# Patient Record
Sex: Female | Born: 1955 | Hispanic: No | Smoking: Never smoker
Health system: Southern US, Community
[De-identification: ages and names within clinical notes are randomized; demographics above are authoritative.]

## PROBLEM LIST (undated history)

## (undated) DIAGNOSIS — M1990 Unspecified osteoarthritis, unspecified site: Secondary | ICD-10-CM

## (undated) HISTORY — PX: JOINT REPLACEMENT: SHX530

## (undated) HISTORY — PX: BRAIN SURGERY: SHX531

## (undated) HISTORY — PX: TUMOR REMOVAL: SHX12

## (undated) HISTORY — PX: CHOLECYSTECTOMY: SHX55

---

## 2018-09-15 NOTE — H&P (Signed)
TOTAL HIP ADMISSION H&P  Patient is admitted for right total hip arthroplasty, anterior approach.  Subjective:  Chief Complaint: Right hip primary OA / pain  HPI: Mary House, 63 y.o. female, has a history of pain and functional disability in the right hip(s) due to arthritis and patient has failed non-surgical conservative treatments for greater than 12 weeks to include NSAID's and/or analgesics, corticosteriod injections, use of assistive devices and activity modification.  Onset of symptoms was gradual starting 2+ years ago with gradually worsening course since that time.The patient noted no past surgery on the right hip(s).  Patient currently rates pain in the right hip at 9 out of 10 with activity. Patient has night pain, worsening of pain with activity and weight bearing, trendelenberg gait, pain that interfers with activities of daily living and pain with passive range of motion. Patient has evidence of periarticular osteophytes and joint space narrowing by imaging studies. This condition presents safety issues increasing the risk of falls.  There is no current active infection.  Risks, benefits and expectations were discussed with the patient.  Risks including but not limited to the risk of anesthesia, blood clots, nerve damage, blood vessel damage, failure of the prosthesis, infection and up to and including death.  Patient understand the risks, benefits and expectations and wishes to proceed with surgery.   PCP: System, Pcp Not In  D/C Plans:       Home  Post-op Meds:       No Rx given  Tranexamic Acid:      To be given - IV   Decadron:      Is to be given  FYI:      ASA  Norco  DME:   Rx given for - 3-n-1;  Already has RW  PT:   No PT     No past medical history on file.    No current facility-administered medications for this encounter.    No current outpatient medications on file.   Allergies not on file   Social History   Tobacco Use  . Smoking status: Not on  file  Substance Use Topics  . Alcohol use: Not on file     Review of Systems  Constitutional: Negative.   HENT: Negative.   Eyes: Negative.   Respiratory: Negative.   Cardiovascular: Negative.   Gastrointestinal: Negative.   Genitourinary: Negative.   Musculoskeletal: Positive for back pain and joint pain.  Skin: Negative.   Neurological: Negative.   Endo/Heme/Allergies: Negative.   Psychiatric/Behavioral: Negative.     Objective:  Physical Exam  Constitutional: She is oriented to person, place, and time. She appears well-developed.  HENT:  Head: Normocephalic.  Eyes: Pupils are equal, round, and reactive to light.  Neck: Neck supple. No JVD present. No tracheal deviation present. No thyromegaly present.  Cardiovascular: Normal rate, regular rhythm and intact distal pulses.  Respiratory: Effort normal and breath sounds normal. No respiratory distress. She has no wheezes.  GI: Soft. There is no abdominal tenderness. There is no guarding.  Musculoskeletal:     Right hip: She exhibits decreased range of motion, decreased strength, tenderness and bony tenderness. She exhibits no swelling, no deformity and no laceration.  Lymphadenopathy:    She has no cervical adenopathy.  Neurological: She is alert and oriented to person, place, and time.  Skin: Skin is warm and dry.  Psychiatric: She has a normal mood and affect.     Imaging Review Plain radiographs demonstrate severe degenerative joint disease of  the right hip(s). The bone quality appears to be good for age and reported activity level.      Assessment/Plan:  End stage arthritis, right hip  The patient history, physical examination, clinical judgement of the provider and imaging studies are consistent with end stage degenerative joint disease of the right hip(s) and total hip arthroplasty is deemed medically necessary. The treatment options including medical management, injection therapy, arthroscopy and arthroplasty  were discussed at length. The risks and benefits of total hip arthroplasty were presented and reviewed. The risks due to aseptic loosening, infection, stiffness, dislocation/subluxation,  thromboembolic complications and other imponderables were discussed.  The patient acknowledged the explanation, agreed to proceed with the plan and consent was signed. Patient is being admitted for inpatient treatment for surgery, pain control, PT, OT, prophylactic antibiotics, VTE prophylaxis, progressive ambulation and ADL's and discharge planning.The patient is planning to be discharged home.    Anastasio AuerbachMatthew S. Hedwig Mcfall   PA-C  09/15/2018, 1:10 PM

## 2018-09-16 NOTE — Progress Notes (Signed)
SPOKE W/PT     SCREENING SYMPTOMS OF COVID 19:   COUGH--NO  RUNNY NOSE--- NO  SORE THROAT---NO  NASAL CONGESTION----NO  SNEEZING----NO  SHORTNESS OF BREATH---NO  DIFFICULTY BREATHING---NO  TEMP >100.0 -----NO  UNEXPLAINED BODY ACHES------NO  CHILLS --------NO  HEADACHES ---------NO  LOSS OF SMELL/ TASTE --------NO    HAVE YOU OR ANY FAMILY MEMBER TRAVELLED PAST 14 DAYS OUT OF THE   COUNTY---NO STATE----NO COUNTRY----NO  HAVE YOU OR ANY FAMILY MEMBER BEEN EXPOSED TO ANYONE WITH COVID 19? NO    

## 2018-09-16 NOTE — Patient Instructions (Signed)
Mary House  09/16/2018   Your procedure is scheduled on: 09-21-18   Report to [facility] Main  Entrance    Report to Admitting at 5:30 AM    Call this number if you have problems the morning of surgery [phone]    Remember: NO SOLID FOOD AFTER MIDNIGHT THE NIGHT PRIOR TO SURGERY. NOTHING BY MOUTH EXCEPT CLEAR LIQUIDS UNTIL 3 HOURS PRIOR TO SCHEULED SURGERY. PLEASE FINISH ENSURE DRINK PER SURGEON ORDER 3 HOURS PRIOR TO SCHEDULED SURGERY TIME WHICH NEEDS TO BE COMPLETED AT 4:30 AM.  CLEAR LIQUID DIET   Foods Allowed                                                                     Foods Excluded  Coffee and tea, regular and decaf                             liquids that you cannot  Plain Jell-O in any flavor                                             see through such as: Fruit ices (not with fruit pulp)                                     milk, soups, orange juice  Iced Popsicles                                    All solid food Carbonated beverages, regular and diet                                    Cranberry, grape and apple juices Sports drinks like Gatorade Lightly seasoned clear broth or consume(fat free) Sugar, honey syrup  Sample Menu Breakfast                                Lunch                                     Supper Cranberry juice                    Beef broth                            Chicken broth Jell-O                                     Grape juice  Apple juice Coffee or tea                        Jell-O                                      Popsicle                                                Coffee or tea                        Coffee or tea  _____________________________________________________________________      BRUSH YOUR TEETH MORNING OF SURGERY AND RINSE YOUR MOUTH OUT, NO CHEWING GUM CANDY OR MINTS.     Take these medicines the morning of surgery with A SIP OF WATER: None                                 You may not have any metal on your body including hair pins and              piercings  Do not wear jewelry, make-up, lotions, powders or perfumes, deodorant             Do not wear nail polish.  Do not shave  48 hours prior to surgery.                 Do not bring valuables to the hospital. [facility] IS NOT             RESPONSIBLE   FOR VALUABLES.  Contacts, dentures or bridgework may not be worn into surgery.       Special Instructions: N/A              Please read over the following fact sheets you were given: _____________________________________________________________________             [facility] - Preparing for Surgery Before surgery, you can play an important role.  Because skin is not sterile, your skin needs to be as free of germs as possible.  You can reduce the number of germs on your skin by washing with CHG (chlorahexidine gluconate) soap before surgery.  CHG is an antiseptic cleaner which kills germs and bonds with the skin to continue killing germs even after washing. Please DO NOT use if you have an allergy to CHG or antibacterial soaps.  If your skin becomes reddened/irritated stop using the CHG and inform your nurse when you arrive at Short Stay. Do not shave (including legs and underarms) for at least 48 hours prior to the first CHG shower.  You may shave your face/neck. Please follow these instructions carefully:  1.  Shower with CHG Soap the night before surgery and the  morning of Surgery.  2.  If you choose to wash your hair, wash your hair first as usual with your  normal  shampoo.  3.  After you shampoo, rinse your hair and body thoroughly to remove the  shampoo.                           4.  Use CHG as you would any other liquid soap.  You can apply chg directly  to the skin and wash                       Gently with a scrungie or clean washcloth.  5.  Apply the CHG Soap to your body ONLY FROM THE NECK DOWN.   Do not use on face/ open                            Wound or open sores. Avoid contact with eyes, ears mouth and genitals (private parts).                       Wash face,  Genitals (private parts) with your normal soap.             6.  Wash thoroughly, paying special attention to the area where your surgery  will be performed.  7.  Thoroughly rinse your body with warm water from the neck down.  8.  DO NOT shower/wash with your normal soap after using and rinsing off  the CHG Soap.                9.  Pat yourself dry with a clean towel.            10.  Wear clean pajamas.            11.  Place clean sheets on your bed the night of your first shower and do not  sleep with pets. Day of Surgery : Do not apply any lotions/deodorants the morning of surgery.  Please wear clean clothes to the hospital/surgery center.  FAILURE TO FOLLOW THESE INSTRUCTIONS MAY RESULT IN THE CANCELLATION OF YOUR SURGERY PATIENT SIGNATURE_________________________________  NURSE SIGNATURE__________________________________  ________________________________________________________________________   Rogelia MireIncentive Spirometer  An incentive spirometer is a tool that can help keep your lungs clear and active. This tool measures how well you are filling your lungs with each breath. Taking long deep breaths may help reverse or decrease the chance of developing breathing (pulmonary) problems (especially infection) following:  A long period of time when you are unable to move or be active. BEFORE THE PROCEDURE   If the spirometer includes an indicator to show your best effort, your nurse or respiratory therapist will set it to a desired goal.  If possible, sit up straight or lean slightly forward. Try not to slouch.  Hold the incentive spirometer in an upright position. INSTRUCTIONS FOR USE  1. Sit on the edge of your bed if possible, or sit up as far as you can in bed or on a chair. 2. Hold the incentive spirometer in an upright position. 3. Breathe out  normally. 4. Place the mouthpiece in your mouth and seal your lips tightly around it. 5. Breathe in slowly and as deeply as possible, raising the piston or the ball toward the top of the column. 6. Hold your breath for 3-5 seconds or for as long as possible. Allow the piston or ball to fall to the bottom of the column. 7. Remove the mouthpiece from your mouth and breathe out normally. 8. Rest for a few seconds and repeat Steps 1 through 7 at least 10 times every 1-2 hours when you are awake. Take your time and take a few normal breaths between deep breaths. 9. The spirometer may include an indicator to show your  best effort. Use the indicator as a goal to work toward during each repetition. 10. After each set of 10 deep breaths, practice coughing to be sure your lungs are clear. If you have an incision (the cut made at the time of surgery), support your incision when coughing by placing a pillow or rolled up towels firmly against it. Once you are able to get out of bed, walk around indoors and cough well. You may stop using the incentive spirometer when instructed by your caregiver.  RISKS AND COMPLICATIONS  Take your time so you do not get dizzy or light-headed.  If you are in pain, you may need to take or ask for pain medication before doing incentive spirometry. It is harder to take a deep breath if you are having pain. AFTER USE  Rest and breathe slowly and easily.  It can be helpful to keep track of a log of your progress. Your caregiver can provide you with a simple table to help with this. If you are using the spirometer at home, follow these instructions: [place] IF:   You are having difficultly using the spirometer.  You have trouble using the spirometer as often as instructed.  Your pain medication is not giving enough relief while using the spirometer.  You develop fever of 100.5 F (38.1 C) or higher. SEEK IMMEDIATE MEDICAL CARE IF:   You cough up bloody sputum  that had not been present before.  You develop fever of 102 F (38.9 C) or greater.  You develop worsening pain at or near the incision site. MAKE SURE YOU:   Understand these instructions.  Will watch your condition.  Will get help right away if you are not doing well or get worse. Document Released: 09/08/2006 Document Revised: 07/21/2011 Document Reviewed: 11/09/2006 [employer] Patient Information 2014 [employer], [place].   ________________________________________________________________________  WHAT IS A BLOOD TRANSFUSION? Blood Transfusion Information  A transfusion is the replacement of blood or some of its parts. Blood is made up of multiple cells which provide different functions.  Red blood cells carry oxygen and are used for blood loss replacement.  White blood cells fight against infection.  Platelets control bleeding.  Plasma helps clot blood.  Other blood products are available for specialized needs, such as hemophilia or other clotting disorders. BEFORE THE TRANSFUSION  Who gives blood for transfusions?   Healthy volunteers who are fully evaluated to make sure their blood is safe. This is blood bank blood. Transfusion therapy is the safest it has ever been in the practice of medicine. Before blood is taken from a donor, a complete history is taken to make sure that person has no history of diseases nor engages in risky social behavior (examples are intravenous drug use or sexual activity with multiple partners). The donor's travel history is screened to minimize risk of transmitting infections, such as malaria. The donated blood is tested for signs of infectious diseases, such as HIV and hepatitis. The blood is then tested to be sure it is compatible with you in order to minimize the chance of a transfusion reaction. If you or a relative donates blood, this is often done in anticipation of surgery and is not appropriate for emergency situations. It takes many days to  process the donated blood. RISKS AND COMPLICATIONS Although transfusion therapy is very safe and saves many lives, the main dangers of transfusion include:   Getting an infectious disease.  Developing a transfusion reaction. This is an allergic reaction to something  in the blood you were given. Every precaution is taken to prevent this. The decision to have a blood transfusion has been considered carefully by your caregiver before blood is given. Blood is not given unless the benefits outweigh the risks. AFTER THE TRANSFUSION  Right after receiving a blood transfusion, you will usually feel much better and more energetic. This is especially true if your red blood cells have gotten low (anemic). The transfusion raises the level of the red blood cells which carry oxygen, and this usually causes an energy increase.  The nurse administering the transfusion will monitor you carefully for complications. HOME CARE INSTRUCTIONS  No special instructions are needed after a transfusion. You may find your energy is better. Speak with your caregiver about any limitations on activity for underlying diseases you may have. SEEK MEDICAL CARE IF:   Your condition is not improving after your transfusion.  You develop redness or irritation at the intravenous (IV) site. SEEK IMMEDIATE MEDICAL CARE IF:  Any of the following symptoms occur over the next 12 hours:  Shaking chills.  You have a temperature by mouth above 102 F (38.9 C), not controlled by medicine.  Chest, back, or muscle pain.  People around you feel you are not acting correctly or are confused.  Shortness of breath or difficulty breathing.  Dizziness and fainting.  You get a rash or develop hives.  You have a decrease in urine output.  Your urine turns a dark color or changes to pink, red, or brown. Any of the following symptoms occur over the next 10 days:  You have a temperature by mouth above 102 F (38.9 C), not controlled by  medicine.  Shortness of breath.  Weakness after normal activity.  The white part of the eye turns yellow (jaundice).  You have a decrease in the amount of urine or are urinating less often.  Your urine turns a dark color or changes to pink, red, or brown. Document Released: 04/25/2000 Document Revised: 07/21/2011 Document Reviewed: 12/13/2007 [facility] Patient Information 2014 [place], [place].  _______________________________________________________________________

## 2018-09-17 ENCOUNTER — Encounter (HOSPITAL_COMMUNITY): Payer: Self-pay

## 2018-09-17 ENCOUNTER — Encounter (HOSPITAL_COMMUNITY)
Admission: RE | Admit: 2018-09-17 | Discharge: 2018-09-17 | Disposition: A | Discharge status: Home / Self Care | Payer: Commercial Managed Care - PPO | Source: Ambulatory Visit | Attending: Orthopedic Surgery | Admitting: Orthopedic Surgery

## 2018-09-17 ENCOUNTER — Other Ambulatory Visit (HOSPITAL_COMMUNITY)
Admission: RE | Admit: 2018-09-17 | Discharge: 2018-09-17 | Disposition: A | Discharge status: Home / Self Care | Payer: Commercial Managed Care - PPO | Source: Ambulatory Visit | Attending: Orthopedic Surgery | Admitting: Orthopedic Surgery

## 2018-09-17 ENCOUNTER — Other Ambulatory Visit: Payer: Self-pay

## 2018-09-17 DIAGNOSIS — M1611 Unilateral primary osteoarthritis, right hip: Secondary | ICD-10-CM | POA: Insufficient documentation

## 2018-09-17 DIAGNOSIS — Z1159 Encounter for screening for other viral diseases: Secondary | ICD-10-CM | POA: Diagnosis not present

## 2018-09-17 DIAGNOSIS — Z01818 Encounter for other preprocedural examination: Secondary | ICD-10-CM | POA: Insufficient documentation

## 2018-09-17 DIAGNOSIS — Z79899 Other long term (current) drug therapy: Secondary | ICD-10-CM | POA: Diagnosis not present

## 2018-09-17 HISTORY — DX: Unspecified osteoarthritis, unspecified site: M19.90

## 2018-09-17 LAB — CBC
HCT: 40.7 % (ref 36.0–46.0)
Hemoglobin: 13.2 g/dL (ref 12.0–15.0)
MCH: 30.6 pg (ref 26.0–34.0)
MCHC: 32.4 g/dL (ref 30.0–36.0)
MCV: 94.4 fL (ref 80.0–100.0)
Platelets: 149 10*3/uL — ABNORMAL LOW (ref 150–400)
RBC: 4.31 MIL/uL (ref 3.87–5.11)
RDW: 12.6 % (ref 11.5–15.5)
WBC: 5.3 10*3/uL (ref 4.0–10.5)
nRBC: 0 % (ref 0.0–0.2)

## 2018-09-17 LAB — BASIC METABOLIC PANEL
Anion gap: 9 (ref 5–15)
BUN: 14 mg/dL (ref 8–23)
CO2: 28 mmol/L (ref 22–32)
Calcium: 9.3 mg/dL (ref 8.9–10.3)
Chloride: 103 mmol/L (ref 98–111)
Creatinine, Ser: 0.48 mg/dL (ref 0.44–1.00)
GFR calc Af Amer: >60 mL/min (ref >60)
GFR calc non Af Amer: >60 mL/min (ref >60)
Glucose, Bld: 97 mg/dL (ref 70–99)
Potassium: 4.1 mmol/L (ref 3.5–5.1)
Sodium: 140 mmol/L (ref 135–145)

## 2018-09-17 LAB — ABO/RH: ABO/RH(D): A POS

## 2018-09-17 LAB — SURGICAL PCR SCREEN
MRSA, PCR: NEGATIVE
Staphylococcus aureus: POSITIVE — AB

## 2018-09-17 NOTE — Progress Notes (Signed)
09-17-18 PCR result routed to Dr. Olin for review 

## 2018-09-18 LAB — NOVEL CORONAVIRUS, NAA (HOSP ORDER, SEND-OUT TO REF LAB; TAT 18-24 HRS): SARS-CoV-2, NAA: NOT DETECTED

## 2018-09-20 NOTE — Progress Notes (Signed)
Anesthesia Chart Review   Case:  [id number] Date/Time:  09/21/18 0715   Procedure:  TOTAL HIP ARTHROPLASTY ANTERIOR APPROACH (Right ) - 70 mins   Anesthesia type:  Spinal   Pre-op diagnosis:  Right hip osteoarthritis   Location:  [facility] ROOM 09 / [facility]   Surgeon:  Durene Romans, MD      DISCUSSION: 63 yo never smoker with h/o right hip OA scheduled for above procedure 09/21/2018 with Dr. Durene Romans.   Negative COVID19 nasopharyngeal swab 09/17/2018.   Platelets 149 at PAT visit 09/21/2018.    Pt can proceed with planned procedure barring acute status change.  VS: BP 134/73   Pulse 80   Temp 36.7 C (Oral)   Resp 16   Ht 5\' 6"  (1.676 m)   Wt 91.2 kg   SpO2 99%   BMI 32.46 kg/m   PROVIDERS: System, Pcp Not In   LABS: Labs reviewed: Acceptable for surgery. (all labs ordered are listed, but only abnormal results are displayed)  Labs Reviewed  SURGICAL PCR SCREEN - Abnormal; Notable for the following components:      Result Value   Staphylococcus aureus POSITIVE (*)    All other components within normal limits  CBC - Abnormal; Notable for the following components:   Platelets 149 (*)    All other components within normal limits  BASIC METABOLIC PANEL  TYPE AND SCREEN  ABO/RH     IMAGES:   EKG:   CV:  Past Medical History:  Diagnosis Date  . Arthritis     Past Surgical History:  Procedure Laterality Date  . 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 CHOLECYSTECTOMY    . TUMOR REMOVAL     Cranial-Removed in college    MEDICATIONS: . acetaminophen-codeine (TYLENOL #3) 300-30 MG tablet  . buprenorphine (BUTRANS) 10 MCG/HR PTWK patch  . Diclofenac-miSOPROStol 75-0.2 MG TBEC  . mupirocin ointment (BACTROBAN) 2 %  . pregabalin (LYRICA) 75 MG capsule   No current facility-administered medications for this encounter.     Janey Genta [facility] Pre-Surgical Testing [phone] 09/20/18 11:28 AM

## 2018-09-20 NOTE — Anesthesia Preprocedure Evaluation (Addendum)
Anesthesia Evaluation  Patient identified by MRN, date of birth, ID band Patient awake    Reviewed: Allergy & Precautions, NPO status , Patient's Chart, lab work & pertinent test results  Airway Mallampati: II  TM Distance: >3 FB Neck ROM: Full    Dental no notable dental hx.    Pulmonary neg pulmonary ROS,    Pulmonary exam normal breath sounds clear to auscultation       Cardiovascular negative cardio ROS Normal cardiovascular exam Rhythm:Regular Rate:Normal     Neuro/Psych negative neurological ROS  negative psych ROS   GI/Hepatic negative GI ROS, Neg liver ROS,   Endo/Other  negative endocrine ROS  Renal/GU negative Renal ROS  negative genitourinary   Musculoskeletal negative musculoskeletal ROS (+)   Abdominal   Peds negative pediatric ROS (+)  Hematology negative hematology ROS (+)   Anesthesia Other Findings   Reproductive/Obstetrics negative OB ROS                             Anesthesia Physical Anesthesia Plan  ASA: II  Anesthesia Plan: Spinal   Post-op Pain Management:    Induction:   PONV Risk Score and Plan: 2 and Ondansetron and Treatment may vary due to age or medical condition  Airway Management Planned: Simple Face Mask  Additional Equipment:   Intra-op Plan:   Post-operative Plan:   Informed Consent: I have reviewed the patients History and Physical, chart, labs and discussed the procedure including the risks, benefits and alternatives for the proposed anesthesia with the patient or authorized representative who has indicated his/her understanding and acceptance.     Dental advisory given  Plan Discussed with: CRNA  Anesthesia Plan Comments: (See PAT note 09/17/2018, Jodell Cipro, PA-C)       Anesthesia Quick Evaluation

## 2018-09-20 NOTE — Progress Notes (Signed)
SPOKE W/  _patient     SCREENING SYMPTOMS OF COVID 19:   COUGH--no  RUNNY NOSE--- no  SORE THROAT---no  NASAL CONGESTION----no  SNEEZING----no  SHORTNESS OF BREATH---no  DIFFICULTY BREATHING---no  TEMP >100.0 -----no  UNEXPLAINED BODY ACHES------no  CHILLS --------no   HEADACHES ---------no  LOSS OF SMELL/ TASTE --------no    HAVE YOU OR ANY FAMILY MEMBER TRAVELLED PAST 14 DAYS OUT OF THE   COUNTY---no STATE----no COUNTRY----no  HAVE YOU OR ANY FAMILY MEMBER BEEN EXPOSED TO ANYONE WITH COVID 19? no    

## 2018-09-21 ENCOUNTER — Other Ambulatory Visit: Payer: Self-pay

## 2018-09-21 ENCOUNTER — Observation Stay (HOSPITAL_COMMUNITY): Payer: Commercial Managed Care - PPO

## 2018-09-21 ENCOUNTER — Inpatient Hospital Stay (HOSPITAL_COMMUNITY)
Admission: RE | Admit: 2018-09-21 | Discharge: 2018-09-22 | DRG: 470 | Disposition: A | Discharge status: Home / Self Care | Payer: Commercial Managed Care - PPO | Attending: Orthopedic Surgery | Admitting: Orthopedic Surgery

## 2018-09-21 ENCOUNTER — Encounter (HOSPITAL_COMMUNITY): Payer: Self-pay | Admitting: *Deleted

## 2018-09-21 ENCOUNTER — Encounter (HOSPITAL_COMMUNITY)
Admission: RE | Disposition: A | Discharge status: Home / Self Care | Payer: Self-pay | Source: Home / Self Care | Attending: Orthopedic Surgery

## 2018-09-21 ENCOUNTER — Inpatient Hospital Stay (HOSPITAL_COMMUNITY): Payer: Commercial Managed Care - PPO | Admitting: Physician Assistant

## 2018-09-21 ENCOUNTER — Inpatient Hospital Stay (HOSPITAL_COMMUNITY): Payer: Commercial Managed Care - PPO | Admitting: Certified Registered Nurse Anesthetist

## 2018-09-21 DIAGNOSIS — M1611 Unilateral primary osteoarthritis, right hip: Secondary | ICD-10-CM | POA: Diagnosis present

## 2018-09-21 DIAGNOSIS — Z96641 Presence of right artificial hip joint: Secondary | ICD-10-CM

## 2018-09-21 DIAGNOSIS — Z96649 Presence of unspecified artificial hip joint: Secondary | ICD-10-CM

## 2018-09-21 DIAGNOSIS — M25551 Pain in right hip: Secondary | ICD-10-CM | POA: Diagnosis present

## 2018-09-21 DIAGNOSIS — Z1159 Encounter for screening for other viral diseases: Secondary | ICD-10-CM | POA: Diagnosis not present

## 2018-09-21 DIAGNOSIS — M25559 Pain in unspecified hip: Secondary | ICD-10-CM

## 2018-09-21 DIAGNOSIS — M25751 Osteophyte, right hip: Secondary | ICD-10-CM | POA: Diagnosis present

## 2018-09-21 HISTORY — PX: TOTAL HIP ARTHROPLASTY: SHX124

## 2018-09-21 LAB — TYPE AND SCREEN
ABO/RH(D): A POS
Antibody Screen: NEGATIVE

## 2018-09-21 SURGERY — ARTHROPLASTY, HIP, TOTAL, ANTERIOR APPROACH
Anesthesia: Spinal | Site: Hip | Laterality: Right

## 2018-09-21 MED ORDER — FENTANYL CITRATE (PF) 100 MCG/2ML IJ SOLN
INTRAMUSCULAR | Status: DC | PRN
  Administered 2018-09-21: 50 ug via INTRAVENOUS

## 2018-09-21 MED ORDER — PROPOFOL 10 MG/ML IV BOLUS
INTRAVENOUS | Status: DC | PRN
  Administered 2018-09-21: 10 mg via INTRAVENOUS

## 2018-09-21 MED ORDER — PREGABALIN 75 MG PO CAPS
150.0000 mg | ORAL_CAPSULE | Freq: Every day | ORAL | Status: DC
  Administered 2018-09-21: 22:00:00 150 mg via ORAL
  Filled 2018-09-21: qty 2

## 2018-09-21 MED ORDER — ONDANSETRON HCL 4 MG PO TABS: 4.0000 mg | ORAL_TABLET | Freq: Four times a day (QID) | ORAL | Status: DC | PRN

## 2018-09-21 MED ORDER — FENTANYL CITRATE (PF) 100 MCG/2ML IJ SOLN
INTRAMUSCULAR | Status: AC
  Filled 2018-09-21: qty 2

## 2018-09-21 MED ORDER — MAGNESIUM CITRATE PO SOLN: 1.0000 | Freq: Once | ORAL | Status: DC | PRN

## 2018-09-21 MED ORDER — DOCUSATE SODIUM 100 MG PO CAPS: 100.0000 mg | ORAL_CAPSULE | Freq: Two times a day (BID) | ORAL | 0 refills | Status: DC

## 2018-09-21 MED ORDER — CEFAZOLIN SODIUM-DEXTROSE 2-4 GM/100ML-% IV SOLN
2.0000 g | INTRAVENOUS | Status: AC
  Administered 2018-09-21: 2 g via INTRAVENOUS
  Filled 2018-09-21: qty 100

## 2018-09-21 MED ORDER — METHOCARBAMOL 500 MG PO TABS: 500.0000 mg | ORAL_TABLET | Freq: Four times a day (QID) | ORAL | 0 refills | Status: DC | PRN

## 2018-09-21 MED ORDER — DIPHENHYDRAMINE HCL 12.5 MG/5ML PO ELIX: 12.5000 mg | ORAL_SOLUTION | ORAL | Status: DC | PRN

## 2018-09-21 MED ORDER — PROPOFOL 500 MG/50ML IV EMUL
INTRAVENOUS | Status: DC | PRN
  Administered 2018-09-21: 50 ug/kg/min via INTRAVENOUS

## 2018-09-21 MED ORDER — DEXAMETHASONE SODIUM PHOSPHATE 10 MG/ML IJ SOLN
10.0000 mg | Freq: Once | INTRAMUSCULAR | Status: DC
  Filled 2018-09-21: qty 1

## 2018-09-21 MED ORDER — HYDROCODONE-ACETAMINOPHEN 7.5-325 MG PO TABS: 1.0000 | ORAL_TABLET | ORAL | 0 refills | Status: DC | PRN

## 2018-09-21 MED ORDER — METHOCARBAMOL 500 MG PO TABS: 500.0000 mg | ORAL_TABLET | Freq: Four times a day (QID) | ORAL | Status: DC | PRN

## 2018-09-21 MED ORDER — HYDROCODONE-ACETAMINOPHEN 7.5-325 MG PO TABS
1.0000 | ORAL_TABLET | ORAL | Status: DC | PRN
  Administered 2018-09-21: 13:00:00 1 via ORAL
  Administered 2018-09-21 – 2018-09-22 (×4): 2 via ORAL
  Filled 2018-09-21 (×2): qty 2
  Filled 2018-09-21: qty 1
  Filled 2018-09-21 (×2): qty 2

## 2018-09-21 MED ORDER — MENTHOL 3 MG MT LOZG: 1.0000 | LOZENGE | OROMUCOSAL | Status: DC | PRN

## 2018-09-21 MED ORDER — ONDANSETRON HCL 4 MG/2ML IJ SOLN
INTRAMUSCULAR | Status: AC
  Filled 2018-09-21: qty 2

## 2018-09-21 MED ORDER — POLYETHYLENE GLYCOL 3350 17 G PO PACK: 17.0000 g | PACK | Freq: Two times a day (BID) | ORAL | 0 refills | Status: DC

## 2018-09-21 MED ORDER — BISACODYL 10 MG RE SUPP: 10.0000 mg | Freq: Every day | RECTAL | Status: DC | PRN

## 2018-09-21 MED ORDER — ALUM & MAG HYDROXIDE-SIMETH 200-200-20 MG/5ML PO SUSP: 15.0000 mL | ORAL | Status: DC | PRN

## 2018-09-21 MED ORDER — BUPIVACAINE IN DEXTROSE 0.75-8.25 % IT SOLN
INTRATHECAL | Status: DC | PRN
  Administered 2018-09-21: 1.8 mL via INTRATHECAL

## 2018-09-21 MED ORDER — ASPIRIN 81 MG PO CHEW
81.0000 mg | CHEWABLE_TABLET | Freq: Two times a day (BID) | ORAL | Status: DC
  Administered 2018-09-21 – 2018-09-22 (×2): 81 mg via ORAL
  Filled 2018-09-21 (×2): qty 1

## 2018-09-21 MED ORDER — METOCLOPRAMIDE HCL 5 MG/ML IJ SOLN: 5.0000 mg | Freq: Three times a day (TID) | INTRAMUSCULAR | Status: DC | PRN

## 2018-09-21 MED ORDER — HYDROMORPHONE HCL 1 MG/ML IJ SOLN
0.5000 mg | INTRAMUSCULAR | Status: DC | PRN
  Administered 2018-09-21 – 2018-09-22 (×3): 0.5 mg via INTRAVENOUS
  Filled 2018-09-21 (×3): qty 1

## 2018-09-21 MED ORDER — FERROUS SULFATE 325 (65 FE) MG PO TABS: 325.0000 mg | ORAL_TABLET | Freq: Three times a day (TID) | ORAL | 3 refills | Status: DC

## 2018-09-21 MED ORDER — FENTANYL CITRATE (PF) 100 MCG/2ML IJ SOLN: 25.0000 ug | INTRAMUSCULAR | Status: DC | PRN

## 2018-09-21 MED ORDER — TRANEXAMIC ACID-NACL 1000-0.7 MG/100ML-% IV SOLN
1000.0000 mg | INTRAVENOUS | Status: AC
  Administered 2018-09-21: 08:00:00 1000 mg via INTRAVENOUS
  Filled 2018-09-21: qty 100

## 2018-09-21 MED ORDER — LACTATED RINGERS IV SOLN
INTRAVENOUS | Status: DC
  Administered 2018-09-21 (×2): via INTRAVENOUS

## 2018-09-21 MED ORDER — HYDROCODONE-ACETAMINOPHEN 5-325 MG PO TABS: 1.0000 | ORAL_TABLET | ORAL | Status: DC | PRN

## 2018-09-21 MED ORDER — CELECOXIB 200 MG PO CAPS
200.0000 mg | ORAL_CAPSULE | Freq: Two times a day (BID) | ORAL | Status: DC
  Administered 2018-09-21 – 2018-09-22 (×2): 200 mg via ORAL
  Filled 2018-09-21 (×2): qty 1

## 2018-09-21 MED ORDER — MIDAZOLAM HCL 2 MG/2ML IJ SOLN
INTRAMUSCULAR | Status: AC
  Filled 2018-09-21: qty 2

## 2018-09-21 MED ORDER — PREGABALIN 75 MG PO CAPS
75.0000 mg | ORAL_CAPSULE | Freq: Every day | ORAL | Status: DC
  Administered 2018-09-22: 09:00:00 75 mg via ORAL
  Filled 2018-09-21: qty 1

## 2018-09-21 MED ORDER — TRANEXAMIC ACID-NACL 1000-0.7 MG/100ML-% IV SOLN
1000.0000 mg | Freq: Once | INTRAVENOUS | Status: AC
  Administered 2018-09-21: 13:00:00 1000 mg via INTRAVENOUS
  Filled 2018-09-21: qty 100

## 2018-09-21 MED ORDER — ACETAMINOPHEN 325 MG PO TABS: 325.0000 mg | ORAL_TABLET | Freq: Four times a day (QID) | ORAL | Status: DC | PRN

## 2018-09-21 MED ORDER — ASPIRIN 81 MG PO CHEW: 81.0000 mg | CHEWABLE_TABLET | Freq: Two times a day (BID) | ORAL | 0 refills | Status: AC

## 2018-09-21 MED ORDER — SODIUM CHLORIDE 0.9 % IV SOLN
INTRAVENOUS | Status: DC
  Administered 2018-09-21 – 2018-09-22 (×2): via INTRAVENOUS

## 2018-09-21 MED ORDER — FERROUS SULFATE 325 (65 FE) MG PO TABS
325.0000 mg | ORAL_TABLET | Freq: Three times a day (TID) | ORAL | Status: DC
  Administered 2018-09-21 – 2018-09-22 (×2): 325 mg via ORAL
  Filled 2018-09-21 (×2): qty 1

## 2018-09-21 MED ORDER — EPHEDRINE SULFATE 50 MG/ML IJ SOLN
INTRAMUSCULAR | Status: DC | PRN
  Administered 2018-09-21 (×2): 5 mg via INTRAVENOUS

## 2018-09-21 MED ORDER — STERILE WATER FOR IRRIGATION IR SOLN
Status: DC | PRN
  Administered 2018-09-21: 2000 mL

## 2018-09-21 MED ORDER — METOCLOPRAMIDE HCL 5 MG PO TABS: 5.0000 mg | ORAL_TABLET | Freq: Three times a day (TID) | ORAL | Status: DC | PRN

## 2018-09-21 MED ORDER — CEFAZOLIN SODIUM-DEXTROSE 2-4 GM/100ML-% IV SOLN
2.0000 g | Freq: Four times a day (QID) | INTRAVENOUS | Status: AC
  Administered 2018-09-21 (×2): 2 g via INTRAVENOUS
  Filled 2018-09-21 (×2): qty 100

## 2018-09-21 MED ORDER — METHOCARBAMOL 500 MG IVPB - SIMPLE MED
500.0000 mg | Freq: Four times a day (QID) | INTRAVENOUS | Status: DC | PRN
  Filled 2018-09-21: qty 50

## 2018-09-21 MED ORDER — MEPERIDINE HCL 50 MG/ML IJ SOLN: 6.2500 mg | INTRAMUSCULAR | Status: DC | PRN

## 2018-09-21 MED ORDER — POLYETHYLENE GLYCOL 3350 17 G PO PACK
17.0000 g | PACK | Freq: Two times a day (BID) | ORAL | Status: DC
  Administered 2018-09-22: 09:00:00 17 g via ORAL
  Filled 2018-09-21 (×2): qty 1

## 2018-09-21 MED ORDER — DEXAMETHASONE SODIUM PHOSPHATE 10 MG/ML IJ SOLN
INTRAMUSCULAR | Status: AC
  Filled 2018-09-21: qty 1

## 2018-09-21 MED ORDER — 0.9 % SODIUM CHLORIDE (POUR BTL) OPTIME
TOPICAL | Status: DC | PRN
  Administered 2018-09-21: 1000 mL

## 2018-09-21 MED ORDER — DOCUSATE SODIUM 100 MG PO CAPS
100.0000 mg | ORAL_CAPSULE | Freq: Two times a day (BID) | ORAL | Status: DC
  Administered 2018-09-21 – 2018-09-22 (×2): 100 mg via ORAL
  Filled 2018-09-21 (×2): qty 1

## 2018-09-21 MED ORDER — PHENOL 1.4 % MT LIQD
1.0000 | OROMUCOSAL | Status: DC | PRN
  Filled 2018-09-21: qty 177

## 2018-09-21 MED ORDER — PROPOFOL 10 MG/ML IV BOLUS
INTRAVENOUS | Status: AC
  Filled 2018-09-21: qty 60

## 2018-09-21 MED ORDER — CHLORHEXIDINE GLUCONATE 4 % EX LIQD: 60.0000 mL | Freq: Once | CUTANEOUS | Status: DC

## 2018-09-21 MED ORDER — ONDANSETRON HCL 4 MG/2ML IJ SOLN: 4.0000 mg | Freq: Four times a day (QID) | INTRAMUSCULAR | Status: DC | PRN

## 2018-09-21 MED ORDER — METOCLOPRAMIDE HCL 5 MG/ML IJ SOLN: 10.0000 mg | Freq: Once | INTRAMUSCULAR | Status: DC | PRN

## 2018-09-21 MED ORDER — MIDAZOLAM HCL 5 MG/5ML IJ SOLN
INTRAMUSCULAR | Status: DC | PRN
  Administered 2018-09-21: 2 mg via INTRAVENOUS

## 2018-09-21 MED ORDER — EPHEDRINE 5 MG/ML INJ
INTRAVENOUS | Status: AC
  Filled 2018-09-21: qty 10

## 2018-09-21 MED ORDER — DEXAMETHASONE SODIUM PHOSPHATE 10 MG/ML IJ SOLN
10.0000 mg | Freq: Once | INTRAMUSCULAR | Status: AC
  Administered 2018-09-21: 08:00:00 10 mg via INTRAVENOUS

## 2018-09-21 MED ORDER — PREGABALIN 75 MG PO CAPS: 75.0000 mg | ORAL_CAPSULE | ORAL | Status: DC

## 2018-09-21 MED ORDER — LACTATED RINGERS IV SOLN: INTRAVENOUS | Status: DC

## 2018-09-21 MED ORDER — ONDANSETRON HCL 4 MG/2ML IJ SOLN
INTRAMUSCULAR | Status: DC | PRN
  Administered 2018-09-21: 4 mg via INTRAVENOUS

## 2018-09-21 SURGICAL SUPPLY — 42 items
BAG DECANTER FOR FLEXI CONT (MISCELLANEOUS) IMPLANT
BAG ZIPLOCK 12X15 (MISCELLANEOUS) IMPLANT
BLADE SAG 18X100X1.27 (BLADE) ×6 IMPLANT
BLADE SURG SZ10 CARB STEEL (BLADE) ×6 IMPLANT
CHLORAPREP W/TINT 26 (MISCELLANEOUS) ×3 IMPLANT
COVER PERINEAL POST (MISCELLANEOUS) ×3 IMPLANT
COVER SURGICAL LIGHT HANDLE (MISCELLANEOUS) ×3 IMPLANT
COVER WAND RF STERILE (DRAPES) IMPLANT
CUP ACETBLR 52 OD PINNACLE (Hips) ×3 IMPLANT
DERMABOND ADVANCED (GAUZE/BANDAGES/DRESSINGS) ×2
DERMABOND ADVANCED .7 DNX12 (GAUZE/BANDAGES/DRESSINGS) ×1 IMPLANT
DRAPE STERI IOBAN 125X83 (DRAPES) ×3 IMPLANT
DRAPE U-SHAPE 47X51 STRL (DRAPES) ×6 IMPLANT
DRESSING AQUACEL AG SP 3.5X10 (GAUZE/BANDAGES/DRESSINGS) ×1 IMPLANT
DRSG AQUACEL AG SP 3.5X10 (GAUZE/BANDAGES/DRESSINGS) ×3
ELECT BLADE TIP CTD 4 INCH (ELECTRODE) ×3 IMPLANT
ELECT REM PT RETURN 15FT ADLT (MISCELLANEOUS) ×3 IMPLANT
ELIMINATOR HOLE APEX DEPUY (Hips) ×3 IMPLANT
GLOVE BIOGEL PI IND STRL 7.5 (GLOVE) ×1 IMPLANT
GLOVE BIOGEL PI IND STRL 8.5 (GLOVE) ×1 IMPLANT
GLOVE BIOGEL PI INDICATOR 7.5 (GLOVE) ×2
GLOVE BIOGEL PI INDICATOR 8.5 (GLOVE) ×2
GLOVE ECLIPSE 8.0 STRL XLNG CF (GLOVE) ×6 IMPLANT
GLOVE ORTHO TXT STRL SZ7.5 (GLOVE) ×3 IMPLANT
GOWN STRL REUS W/TWL 2XL LVL3 (GOWN DISPOSABLE) ×3 IMPLANT
GOWN STRL REUS W/TWL LRG LVL3 (GOWN DISPOSABLE) ×3 IMPLANT
HEAD CERAMIC 36 PLUS5 (Hips) ×3 IMPLANT
HOLDER FOLEY CATH W/STRAP (MISCELLANEOUS) ×3 IMPLANT
KIT TURNOVER KIT A (KITS) IMPLANT
LINER NEUTRAL 52X36MM PLUS 4 (Liner) ×3 IMPLANT
PACK ANTERIOR HIP CUSTOM (KITS) ×3 IMPLANT
SCREW 6.5MMX30MM (Screw) ×3 IMPLANT
STEM FEMORAL SZ6 HIGH ACTIS (Stem) ×3 IMPLANT
SUT MNCRL AB 4-0 PS2 18 (SUTURE) ×3 IMPLANT
SUT STRATAFIX 0 PDS 27 VIOLET (SUTURE) ×3
SUT VIC AB 1 CT1 36 (SUTURE) ×9 IMPLANT
SUT VIC AB 2-0 CT1 27 (SUTURE) ×4
SUT VIC AB 2-0 CT1 TAPERPNT 27 (SUTURE) ×2 IMPLANT
SUTURE STRATFX 0 PDS 27 VIOLET (SUTURE) ×1 IMPLANT
TRAY FOLEY MTR SLVR 16FR STAT (SET/KITS/TRAYS/PACK) IMPLANT
WATER STERILE IRR 1000ML POUR (IV SOLUTION) ×3 IMPLANT
YANKAUER SUCT BULB TIP 10FT TU (MISCELLANEOUS) IMPLANT

## 2018-09-21 NOTE — Interval H&P Note (Signed)
History and Physical Interval Note:  09/21/2018 6:58 AM  Mary House  has presented today for surgery, with the diagnosis of Right hip osteoarthritis.  The various methods of treatment have been discussed with the patient and family. After consideration of risks, benefits and other options for treatment, the patient has consented to  Procedure(s) with comments: TOTAL HIP ARTHROPLASTY ANTERIOR APPROACH (Right) - 70 mins as a surgical intervention.  The patient's history has been reviewed, patient examined, no change in status, stable for surgery.  I have reviewed the patient's chart and labs.  Questions were answered to the patient's satisfaction.     Dinisha Cai D Colum Colt   

## 2018-09-21 NOTE — Transfer of Care (Signed)
Immediate Anesthesia Transfer of Care Note  Patient: Mary House  Procedure(s) Performed: TOTAL HIP ARTHROPLASTY ANTERIOR APPROACH (Right Hip)  Patient Location: PACU  Anesthesia Type:Spinal  Level of Consciousness: awake, alert  and oriented  Airway & Oxygen Therapy: Patient Spontanous Breathing and Patient connected to face mask oxygen  Post-op Assessment: Report given to RN and Post -op Vital signs reviewed and stable  Post vital signs: Reviewed and stable  Last Vitals:  Vitals Value Taken Time  BP 101/55 09/21/2018  9:26 AM  Temp    Pulse 65 09/21/2018  9:28 AM  Resp 11 09/21/2018  9:28 AM  SpO2 100 % 09/21/2018  9:28 AM  Vitals shown include unvalidated device data.  Last Pain:  Vitals:   09/21/18 0600  TempSrc: Oral         Complications: No apparent anesthesia complications

## 2018-09-21 NOTE — Evaluation (Signed)
Physical Therapy Evaluation Patient Details Name: Mary House MRN: [id number] DOB: 07/30/1955 Today's Date: 09/21/2018   History of Present Illness  63 yo female s/p R THA-DA 5/12.   Clinical Impression  On eval POD 0, pt required Mod assist for bed mobility and Min guard-Min assist for all other mobility. She walked ~55 feet with a RW. Moderate pain with activity. Plan is for pt to d/c home. Will follow and progress activity as tolerated.     Follow Up Recommendations Follow surgeon's recommendation for DC plan and follow-up therapies    Equipment Recommendations  (continuing to assess-pt has rollator she wants to use)    Recommendations for Other Services       Precautions / Restrictions Precautions Precautions: Fall Precaution Comments: pt prefers shoes Restrictions Weight Bearing Restrictions: No Other Position/Activity Restrictions: WBAT      Mobility  Bed Mobility Overal bed mobility: Needs Assistance Bed Mobility: Supine to Sit     Supine to sit: Mod assist;HOB elevated     General bed mobility comments: Assist for bil LEs. Increased time. Cues for safety, technique.   Transfers Overall transfer level: Needs assistance Equipment used: Rolling walker (2 wheeled) Transfers: Sit to/from Stand Sit to Stand: From elevated surface;Min assist         General transfer comment: VCS safety, technique, hand placement. Assist to rise, stabilize  Ambulation/Gait Ambulation/Gait assistance: Min assist Gait Distance (Feet): 55 Feet Assistive device: Rolling walker (2 wheeled) Gait Pattern/deviations: Step-to pattern     General Gait Details: close guard for safety. VCs safety, sequence. Slow gait speed.   Stairs            Wheelchair Mobility    Modified Rankin (Stroke Patients Only)       Balance Overall balance assessment: Mild deficits observed, not formally tested                                           Pertinent  Vitals/Pain Pain Assessment: 0-10 Pain Score: 6  Pain Location: R hip Pain Descriptors / Indicators: Aching;Sore Pain Intervention(s): Monitored during session;Repositioned;Ice applied    Home Living Family/patient expects to be discharged to:: Private residence Living Arrangements: Alone Available Help at Discharge: Friend(s);Available PRN/intermittently("[name]") Type of Home: House Home Access: Stairs to enter Entrance Stairs-Rails: Right Entrance Stairs-Number of Steps: 2+1 Home Layout: One level Home Equipment: Walker - 4 wheels;Grab bars - tub/shower;Grab bars - toilet      Prior Function Level of Independence: Independent               Hand Dominance        Extremity/Trunk Assessment   Upper Extremity Assessment Upper Extremity Assessment: Defer to OT evaluation    Lower Extremity Assessment Lower Extremity Assessment: Generalized weakness    Cervical / Trunk Assessment Cervical / Trunk Assessment: Normal  Communication   Communication: No difficulties  Cognition Arousal/Alertness: Awake/alert Behavior During Therapy: WFL for tasks assessed/performed Overall Cognitive Status: Within Functional Limits for tasks assessed                                        General Comments      Exercises     Assessment/Plan    PT Assessment Patient needs continued PT services  PT Problem List Decreased strength;Decreased  range of motion;Decreased mobility;Decreased activity tolerance;Decreased balance;Decreased knowledge of use of DME;Pain       PT Treatment Interventions DME instruction;Gait training;Functional mobility training;Therapeutic activities;Balance training;Patient/family education;Therapeutic exercise    PT Goals (Current goals can be found in the Care Plan section)  Acute Rehab PT Goals Patient Stated Goal: regain independence. less pain PT Goal Formulation: With patient Time For Goal Achievement: 10/05/18 Potential to Achieve  Goals: Good    Frequency 7X/week   Barriers to discharge        Co-evaluation               AM-PAC PT "6 Clicks" Mobility  Outcome Measure Help needed turning from your back to your side while in a flat bed without using bedrails?: A Lot Help needed moving from lying on your back to sitting on the side of a flat bed without using bedrails?: A Lot Help needed moving to and from a bed to a chair (including a wheelchair)?: A Little Help needed standing up from a chair using your arms (e.g., wheelchair or bedside chair)?: A Little Help needed to walk in hospital room?: A Little Help needed climbing 3-5 steps with a railing? : A Little 6 Click Score: 16    End of Session Equipment Utilized During Treatment: Gait belt Activity Tolerance: Patient tolerated treatment well Patient left: in chair;with call bell/phone within reach   PT Visit Diagnosis: Pain;Other abnormalities of gait and mobility (R26.89) Pain - Right/Left: Right Pain - part of body: Hip    Time: 1440-1502 PT Time Calculation (min) (ACUTE ONLY): 22 min   Charges:   PT Evaluation $PT Eval Low Complexity: 1 Low            Rebeca Alert, PT Acute Rehabilitation Services Pager: [phone] Office: [phone]

## 2018-09-21 NOTE — Op Note (Signed)
NAME:  Mary House                ACCOUNT NO.: [id number]      MEDICAL RECORD NO.: [id number]      FACILITY:  [facility]      PHYSICIAN:  Shelda Pal  DATE OF BIRTH:  1955/10/13     DATE OF PROCEDURE:  09/21/2018                                 OPERATIVE REPORT         PREOPERATIVE DIAGNOSIS: Right  hip osteoarthritis.      POSTOPERATIVE DIAGNOSIS:  Right hip osteoarthritis.      PROCEDURE:  Right total hip replacement through an anterior approach   utilizing DePuy THR system, component size 52mm pinnacle cup, a size 36+4 neutral   Altrex liner, a size 6 Hi Actis stem with a 36+5 delta ceramic   ball.      SURGEON:  Madlyn Frankel. Charlann Boxer, M.D.      ASSISTANT:  Dennie Bible, PA-C     ANESTHESIA:  Spinal.      SPECIMENS:  None.      COMPLICATIONS:  None.      BLOOD LOSS:  400 cc     DRAINS:  None.      INDICATION OF THE PROCEDURE:  [name] is a 63 y.o. female who had   presented to office for evaluation of right hip pain.  Radiographs revealed   progressive degenerative changes with bone-on-bone   articulation of the  hip joint, including subchondral cystic changes and osteophytes.  The patient had painful limited range of   motion significantly affecting their overall quality of life and function.  The patient was failing to    respond to conservative measures including medications and/or injections and activity modification and at this point was ready   to proceed with more definitive measures.  Consent was obtained for   benefit of pain relief.  Specific risks of infection, DVT, component   failure, dislocation, neurovascular injury, and need for revision surgery were reviewed in the office as well discussion of   the anterior versus posterior approach were reviewed.     PROCEDURE IN DETAIL:  The patient was brought to operative theater.   Once adequate anesthesia, preoperative antibiotics, 2 gm of Ancef, 1 gm of Tranexamic Acid, and 10 mg of  Decadron were administered, the patient was positioned supine on the [employer] table.  Once the patient was safely positioned with adequate padding of boney prominences we predraped out the hip, and used fluoroscopy to confirm orientation of the pelvis.      The right hip was then prepped and draped from proximal iliac crest to   mid thigh with a shower curtain technique.      Time-out was performed identifying the patient, planned procedure, and the appropriate extremity.     An incision was then made 2 cm lateral to the   anterior superior iliac spine extending over the orientation of the   tensor fascia lata muscle and sharp dissection was carried down to the   fascia of the muscle.      The fascia was then incised.  The muscle belly was identified and swept   laterally and retractor placed along the superior neck.  Following   cauterization of the circumflex vessels and removing some pericapsular  fat, a second cobra retractor was placed on the inferior neck.  A T-capsulotomy was made along the line of the   superior neck to the trochanteric fossa, then extended proximally and   distally.  Tag sutures were placed and the retractors were then placed   intracapsular.  We then identified the trochanteric fossa and   orientation of my neck cut and then made a neck osteotomy with the femur on traction.  The femoral   head was removed without difficulty or complication.  Traction was let   off and retractors were placed posterior and anterior around the   acetabulum.      The labrum and foveal tissue were debrided.  I began reaming with a 45 mm   reamer and reamed up to 51 mm reamer with good bony bed preparation and a 52 mm  cup was chosen.  The final 52 mm Pinnacle cup was then impacted under fluoroscopy to confirm the depth of penetration and orientation with respect to   Abduction and forward flexion.  A screw was placed into the ilium followed by the hole eliminator.  The final   36+4  neutral Altrex liner was impacted with good visualized rim fit.  The cup was positioned anatomically within the acetabular portion of the pelvis.      At this point, the femur was rolled to 100 degrees.  Further capsule was   released off the inferior aspect of the femoral neck.  I then   released the superior capsule proximally.  With the leg in a neutral position the hook was placed laterally   along the femur under the vastus lateralis origin and elevated manually and then held in position using the hook attachment on the bed.  The leg was then extended and adducted with the leg rolled to 100   degrees of external rotation.  Retractors were placed along the medial calcar and posteriorly over the greater trochanter.  Once the proximal femur was fully   exposed, I used a box osteotome to set orientation.  I then began   broaching with the starting chili pepper broach and passed this by hand and then broached up to 6.  With the 6 broach in place I chose a high offset neck and did several trial reductions.  The offset was appropriate, leg lengths   appeared to be equal best matched with the +5 head ball trial confirmed radiographically.   Given these findings, I went ahead and dislocated the hip, repositioned all   retractors and positioned the right hip in the extended and abducted position.  The final 6 Hi Actis stem was   chosen and it was impacted down to the level of neck cut.  Based on this   and the trial reductions, a final 36+5 delta ceramic ball was chosen and   impacted onto a clean and dry trunnion, and the hip was reduced.  The   hip had been irrigated throughout the case again at this point.  The fascia of the   tensor fascia lata muscle was then reapproximated using #1 Vicryl and #0 Stratafix sutures.  The   remaining wound was closed with 2-0 Vicryl and running 4-0 Monocryl.   The hip was cleaned, dried, and dressed sterilely using Dermabond and   Aquacel dressing.  The patient was  then brought   to recovery room in stable condition tolerating the procedure well.    Dennie Bible, PA-C was present for the entirety of the  case involved from   preoperative positioning, perioperative retractor management, general   facilitation of the case, as well as primary wound closure as assistant.            Madlyn FrankelMatthew D. Charlann Boxerlin, M.D.        09/21/2018 9:03 AM

## 2018-09-21 NOTE — Interval H&P Note (Signed)
History and Physical Interval Note:  09/21/2018 6:58 AM  Mary House  has presented today for surgery, with the diagnosis of Right hip osteoarthritis.  The various methods of treatment have been discussed with the patient and family. After consideration of risks, benefits and other options for treatment, the patient has consented to  Procedure(s) with comments: TOTAL HIP ARTHROPLASTY ANTERIOR APPROACH (Right) - 70 mins as a surgical intervention.  The patient's history has been reviewed, patient examined, no change in status, stable for surgery.  I have reviewed the patient's chart and labs.  Questions were answered to the patient's satisfaction.     Shelda Pal

## 2018-09-21 NOTE — Anesthesia Procedure Notes (Signed)
Spinal  Patient location during procedure: OR Staffing Anesthesiologist: Montez Hageman, MD Performed: anesthesiologist  Preanesthetic Checklist Completed: patient identified, site marked, surgical consent, pre-op evaluation, timeout performed, IV checked, risks and benefits discussed and monitors and equipment checked Spinal Block Patient position: sitting Prep: DuraPrep Patient monitoring: heart rate, continuous pulse ox and blood pressure Approach: midline Location: L4-5 Injection technique: single-shot Needle Needle type: Quincke  Needle gauge: 22 G Needle length: 9 cm Additional Notes Expiration date of kit checked and confirmed. Patient tolerated procedure well, without complications.

## 2018-09-21 NOTE — Anesthesia Postprocedure Evaluation (Signed)
Anesthesia Post Note  Patient: Mary House  Procedure(s) Performed: TOTAL HIP ARTHROPLASTY ANTERIOR APPROACH (Right Hip)     Patient location during evaluation: PACU Anesthesia Type: Spinal Level of consciousness: awake and alert Pain management: pain level controlled Vital Signs Assessment: post-procedure vital signs reviewed and stable Respiratory status: spontaneous breathing and respiratory function stable Cardiovascular status: blood pressure returned to baseline and stable Postop Assessment: no headache, no backache, spinal receding and no apparent nausea or vomiting Anesthetic complications: no    Last Vitals:  Vitals:   09/21/18 1200 09/21/18 1223  BP: (!) 102/55 (!) 118/56  Pulse: (!) 55 (!) 58  Resp: 10 14  Temp: 36.6 C 36.6 C  SpO2: 100% 100%    Last Pain:  Vitals:   09/21/18 1308  TempSrc:   PainSc: 3                  Phillips Grout

## 2018-09-21 NOTE — Anesthesia Procedure Notes (Signed)
Procedure Name: MAC Date/Time: 09/21/2018 7:24 AM Performed by: Maxwell Caul, CRNA Pre-anesthesia Checklist: Patient identified, Emergency Drugs available, Suction available and Patient being monitored Oxygen Delivery Method: Simple face mask

## 2018-09-22 ENCOUNTER — Encounter (HOSPITAL_COMMUNITY): Payer: Self-pay | Admitting: Orthopedic Surgery

## 2018-09-22 LAB — BASIC METABOLIC PANEL
Anion gap: 7 (ref 5–15)
BUN: 11 mg/dL (ref 8–23)
CO2: 25 mmol/L (ref 22–32)
Calcium: 8.4 mg/dL — ABNORMAL LOW (ref 8.9–10.3)
Chloride: 105 mmol/L (ref 98–111)
Creatinine, Ser: 0.48 mg/dL (ref 0.44–1.00)
GFR calc Af Amer: >60 mL/min (ref >60)
GFR calc non Af Amer: >60 mL/min (ref >60)
Glucose, Bld: 166 mg/dL — ABNORMAL HIGH (ref 70–99)
Potassium: 3.7 mmol/L (ref 3.5–5.1)
Sodium: 137 mmol/L (ref 135–145)

## 2018-09-22 LAB — CBC
HCT: 31.3 % — ABNORMAL LOW (ref 36.0–46.0)
Hemoglobin: 10.3 g/dL — ABNORMAL LOW (ref 12.0–15.0)
MCH: 31 pg (ref 26.0–34.0)
MCHC: 32.9 g/dL (ref 30.0–36.0)
MCV: 94.3 fL (ref 80.0–100.0)
Platelets: 135 10*3/uL — ABNORMAL LOW (ref 150–400)
RBC: 3.32 MIL/uL — ABNORMAL LOW (ref 3.87–5.11)
RDW: 12.4 % (ref 11.5–15.5)
WBC: 10.2 10*3/uL (ref 4.0–10.5)
nRBC: 0 % (ref 0.0–0.2)

## 2018-09-22 NOTE — Discharge Instructions (Signed)

## 2018-09-22 NOTE — Progress Notes (Signed)
Physical Therapy Treatment Patient Details Name: Mary House MRN: [id number] DOB: 11/27/1955 Today's Date: 09/22/2018    History of Present Illness 63 yo female s/p R THA-DA 5/12.     PT Comments    Progressing well with mobility. Reviewed exercises, gait training, and stair training. Issued HEP for pt to perform 3x/day (start standing exercises in ~1week). All education completed. Okay to d/c from PT standpoint-made RN aware.     Follow Up Recommendations  Follow surgeon's recommendation for DC plan and follow-up therapies     Equipment Recommendations  Rolling walker with 5" wheels    Recommendations for Other Services       Precautions / Restrictions Precautions Precautions: Fall Precaution Comments: pt prefers shoes Restrictions Weight Bearing Restrictions: No Other Position/Activity Restrictions: WBAT    Mobility  Bed Mobility   Bed Mobility: Supine to Sit;Sit to Supine     Supine to sit: Modified independent (Device/Increase time) Sit to supine: Min guard   General bed mobility comments: oob in recliner  Transfers Overall transfer level: Needs assistance Equipment used: Rolling walker (2 wheeled);4-wheeled walker Transfers: Sit to/from Stand Sit to Stand: Modified independent (Device/Increase time)         General transfer comment: no cues needed  Ambulation/Gait Ambulation/Gait assistance: Supervision Gait Distance (Feet): 125 Feet Assistive device: 4-wheeled walker Gait Pattern/deviations: Step-through pattern     General Gait Details: close guard for safety. VCs safety, sequence. Slow gait speed.   Stairs Stairs: Yes Stairs assistance: Min guard Stair Management: One rail Right;Sideways Number of Stairs: 2 General stair comments: up and over portable steps. Pt used 2 hands on R rail. VCs safety, sequence. Close guard for safety.    Wheelchair Mobility    Modified Rankin (Stroke Patients Only)       Balance                                            Cognition Arousal/Alertness: Awake/alert Behavior During Therapy: WFL for tasks assessed/performed Overall Cognitive Status: Within Functional Limits for tasks assessed                                        Exercises Total Joint Exercises Ankle Circles/Pumps: AROM;Both;10 reps;Seated Quad Sets: AROM;Both;10 reps;Seated Heel Slides: AAROM;Right;10 reps;Supine Hip ABduction/ADduction: AAROM;Right;10 reps;Supine    General Comments General comments (skin integrity, edema, etc.): checked: friend allowed into gift shop to purchase AE      Pertinent Vitals/Pain Pain Assessment: 0-10 Pain Score: 5  Pain Location: R hip Pain Descriptors / Indicators: Aching;Sore Pain Intervention(s): Monitored during session;Repositioned;Ice applied    Home Living Family/patient expects to be discharged to:: Private residence Living Arrangements: Alone Available Help at Discharge: Friend(s);Available PRN/intermittently         Home Equipment: Walker - 4 wheels;Grab bars - tub/shower;Grab bars - toilet Additional Comments: friend picking up 3:1 for her    Prior Function Level of Independence: Independent          PT Goals (current goals can now be found in the care plan section) Acute Rehab PT Goals Patient Stated Goal: regain independence. less pain Progress towards PT goals: Progressing toward goals    Frequency    7X/week      PT Plan Current plan remains appropriate  Co-evaluation              AM-PAC PT "6 Clicks" Mobility   Outcome Measure  Help needed turning from your back to your side while in a flat bed without using bedrails?: A Little Help needed moving from lying on your back to sitting on the side of a flat bed without using bedrails?: A Little Help needed moving to and from a bed to a chair (including a wheelchair)?: A Little Help needed standing up from a chair using your arms (e.g., wheelchair or  bedside chair)?: A Little Help needed to walk in hospital room?: A Little Help needed climbing 3-5 steps with a railing? : A Little 6 Click Score: 18    End of Session Equipment Utilized During Treatment: Gait belt Activity Tolerance: Patient tolerated treatment well Patient left: in chair;with call bell/phone within reach   PT Visit Diagnosis: Other abnormalities of gait and mobility (R26.89);Pain Pain - Right/Left: Right Pain - part of body: Hip     Time: 1030-1100 PT Time Calculation (min) (ACUTE ONLY): 30 min  Charges:  $Gait Training: 8-22 mins $Therapeutic Exercise: 8-22 mins                        Rebeca Alert, PT Acute Rehabilitation Services Pager: [phone] Office: [phone]

## 2018-09-22 NOTE — Evaluation (Signed)
Occupational Therapy Evaluation Patient Details Name: Mary House MRN: [id number] DOB: 1956-01-19 Today's Date: 09/22/2018    History of Present Illness 63 yo female s/p R THA-DA 5/12.    Clinical Impression   Pt seen for evaluation and educated on adls, tub transfers and toilet transfers.  She has a grab bar in tub but may want to consider tub transfer bench. Will return prior to home to have her practice this transfer.     Follow Up Recommendations  Supervision - Intermittent    Equipment Recommendations  (friend getting 3;1, possibly tub bench)    Recommendations for Other Services       Precautions / Restrictions Precautions Precautions: Fall Precaution Comments: pt prefers shoes Restrictions Other Position/Activity Restrictions: WBAT      Mobility Bed Mobility   Bed Mobility: Supine to Sit;Sit to Supine     Supine to sit: Min guard Sit to supine: Min guard   General bed mobility comments: no rails, flat bed, used leg lifter for RLE back to bed.  Practiced twice  Transfers   Equipment used: Rolling walker (2 wheeled)   Sit to Stand: Min guard;Supervision         General transfer comment: for safety; cue for hand placement    Balance                                           ADL either performed or assessed with clinical judgement   ADL Overall ADL's : Needs assistance/impaired             Lower Body Bathing: Min guard;Sit to/from stand;With adaptive equipment       Lower Body Dressing: Min guard;Sit to/from stand;With adaptive equipment   Toilet Transfer: Min guard;Ambulation;BSC;RW             General ADL Comments: educated on AE and used for socks/shoes.  Pt wants to get kit.  Used gait belt for in/out of bed. Educated on tub transfers. Pt would benefit from tub bench, and she would like to practice this.  Breakfast arrived, so will return later for this     Vision         Perception     Praxis       Pertinent Vitals/Pain Pain Score: 6  Pain Location: R hip Pain Descriptors / Indicators: Aching;Sore Pain Intervention(s): Limited activity within patient's tolerance;Monitored during session;Repositioned;RN gave pain meds during session;Patient requesting pain meds-RN notified;Ice applied     Hand Dominance     Extremity/Trunk Assessment Upper Extremity Assessment Upper Extremity Assessment: Overall WFL for tasks assessed           Communication Communication Communication: No difficulties   Cognition Arousal/Alertness: Awake/alert Behavior During Therapy: WFL for tasks assessed/performed Overall Cognitive Status: Within Functional Limits for tasks assessed                                     General Comments       Exercises     Shoulder Instructions      Home Living Family/patient expects to be discharged to:: Private residence Living Arrangements: Alone Available Help at Discharge: Friend(s);Available PRN/intermittently               Bathroom Shower/Tub: Teacher, early years/pre: Standard  Home Equipment: Walker - 4 wheels;Grab bars - tub/shower;Grab bars - toilet   Additional Comments: friend picking up 3:1 for her      Prior Functioning/Environment Level of Independence: Independent                 OT Problem List: Decreased knowledge of use of DME or AE;Pain      OT Treatment/Interventions: Self-care/ADL training;DME and/or AE instruction;Patient/family education    OT Goals(Current goals can be found in the care plan section) Acute Rehab OT Goals Patient Stated Goal: regain independence. less pain OT Goal Formulation: With patient Time For Goal Achievement: 09/23/18 Potential to Achieve Goals: Good  OT Frequency: Min 2X/week   Barriers to D/C:            Co-evaluation              AM-PAC OT "6 Clicks" Daily Activity     Outcome Measure Help from another person eating meals?: None Help from  another person taking care of personal grooming?: A Little Help from another person toileting, which includes using toliet, bedpan, or urinal?: A Little Help from another person bathing (including washing, rinsing, drying)?: A Little Help from another person to put on and taking off regular upper body clothing?: A Little Help from another person to put on and taking off regular lower body clothing?: A Little 6 Click Score: 19   End of Session    Activity Tolerance: Patient tolerated treatment well Patient left: in bed;with call bell/phone within reach  OT Visit Diagnosis: Pain Pain - Right/Left: Right Pain - part of body: Hip                Time: 0821-0910 OT Time Calculation (min): 49 min Charges:  OT General Charges $OT Visit: 1 Visit OT Evaluation $OT Eval Low Complexity: 1 Low OT Treatments $Self Care/Home Management : 23-37 mins  Lesle Chris, OTR/L Acute Rehabilitation Services [phone] [facility] pager [phone] office 09/22/2018  [place] 09/22/2018, 9:25 AM

## 2018-09-22 NOTE — Discharge Summary (Signed)
Physician Discharge Summary   Patient ID: Mary House MRN: [id number] DOB/AGE: 63/17/1957 63 y.o.  Admit date: 09/21/2018 Discharge date: 09/22/2018  Primary Diagnosis: Right hip osteoarthritis  Admission Diagnoses:  Past Medical History:  Diagnosis Date   Arthritis    Discharge Diagnoses:   Principal Problem:   S/P right THA, AA Active Problems:   Status post right hip replacement  Estimated body mass index is 32.45 kg/m as calculated from the following:   Height as of this encounter:  (1.676 m).   Weight as of this encounter: 91.2 kg.  Procedure:  Procedure(s) (LRB): TOTAL HIP ARTHROPLASTY ANTERIOR APPROACH (Right)   Consults: None  HPI: Mary House is a 63 y.o. female who had presented to office for evaluation of right hip pain.  Radiographs revealed progressive degenerative changes with bone-on-bone articulation of the  hip joint, including subchondral cystic changes and osteophytes.  The patient had painful limited range of motion significantly affecting their overall quality of life and function.  The patient was failing to respond to conservative measures including medications and/or injections and activity modification and at this point was ready to proceed with more definitive measures.  Consent was obtained for benefit of pain relief.  Specific risks of infection, DVT, component failure, dislocation, neurovascular injury, and need for revision surgery were reviewed in the office as well discussion of the anterior versus posterior approach were reviewed.  Laboratory Data: Admission on 09/21/2018, Discharged on 09/22/2018  Component Date Value Ref Range Status   WBC 09/22/2018 10.2  4.0 - 10.5 K/uL Final   RBC 09/22/2018 3.32* 3.87 - 5.11 MIL/uL Final   Hemoglobin 09/22/2018 10.3* 12.0 - 15.0 g/dL Final   HCT 91/47/8295 31.3* 36.0 - 46.0 % Final   MCV 09/22/2018 94.3  80.0 - 100.0 fL Final   MCH 09/22/2018 31.0  26.0 - 34.0 pg Final   MCHC 09/22/2018 32.9   30.0 - 36.0 g/dL Final   RDW 62/13/0865 12.4  11.5 - 15.5 % Final   Platelets 09/22/2018 135* 150 - 400 K/uL Final   nRBC 09/22/2018 0.0  0.0 - 0.2 % Final   Performed at [facility], [address] [place], [place] [address]   Sodium 09/22/2018 137  135 - 145 mmol/L Final   Potassium 09/22/2018 3.7  3.5 - 5.1 mmol/L Final   Chloride 09/22/2018 105  98 - 111 mmol/L Final   CO2 09/22/2018 25  22 - 32 mmol/L Final   Glucose, Bld 09/22/2018 166* 70 - 99 mg/dL Final   BUN 62/95/2841 11  8 - 23 mg/dL Final   Creatinine, Ser 09/22/2018 0.48  0.44 - 1.00 mg/dL Final   Calcium 32/44/0102 8.4* 8.9 - 10.3 mg/dL Final   GFR calc non Af Amer 09/22/2018 >60  >60 mL/min Final   GFR calc Af Amer 09/22/2018 >60  >60 mL/min Final   Anion gap 09/22/2018 7  5 - 15 Final   Performed at [facility], [address], [place], [place] [address]  Hospital Outpatient Visit on 09/17/2018  Component Date Value Ref Range Status   SARS-CoV-2, NAA 09/17/2018 NOT DETECTED  NOT DETECTED Final   Comment: (NOTE) This test was developed and its performance characteristics determined by [employer]. This test has not been FDA cleared or approved. This test has been authorized by FDA under an Emergency Use Authorization (EUA). This test is only authorized for the duration of time the declaration that circumstances exist justifying the authorization of the  emergency use of in vitro diagnostic tests for detection of SARS-CoV-2 virus and/or diagnosis of COVID-19 infection under section 564(b)(1) of the Act, 21 U.S.C. 829FAO-1(H)(0), unless the authorization is terminated or revoked sooner. When diagnostic testing is negative, the possibility of a false negative result should be considered in the context of a patient's recent exposures and the presence of clinical signs and symptoms consistent with COVID-19. An individual without symptoms of COVID-19 and who  is not shedding SARS-CoV-2 virus would expect to have a negative (not detected) result in this assay. Performed                           At: [facility] [address] [place], [place] [id number] Jolene Schimke MD EX:[id number]    Coronavirus Source 09/17/2018 NASOPHARYNGEAL   Final   Performed at [facility], [address], [place], [place] [address]  Hospital Outpatient Visit on 09/17/2018  Component Date Value Ref Range Status   ABO/RH(D) 09/17/2018 A POS   Final   Antibody Screen 09/17/2018 NEG   Final   Sample Expiration 09/17/2018 09/24/2018,2359   Final   Extend sample reason 09/17/2018    Final                   Value:NO TRANSFUSIONS OR PREGNANCY IN THE PAST 3 MONTHS Performed at [facility], [address], [place], [place] [address]    MRSA, PCR 09/17/2018 NEGATIVE  NEGATIVE Final   Staphylococcus aureus 09/17/2018 POSITIVE* NEGATIVE Final   Comment: (NOTE) The Xpert SA Assay (FDA approved for NASAL specimens in patients 28 years of age and older), is one component of a comprehensive surveillance program. It is not intended to diagnose infection nor to guide or monitor treatment. Performed at [facility], [address], [place], [place] [address]    Sodium 09/17/2018 140  135 - 145 mmol/L Final   Potassium 09/17/2018 4.1  3.5 - 5.1 mmol/L Final   Chloride 09/17/2018 103  98 - 111 mmol/L Final   CO2 09/17/2018 28  22 - 32 mmol/L Final   Glucose, Bld 09/17/2018 97  70 - 99 mg/dL Final   BUN 42/59/5638 14  8 - 23 mg/dL Final   Creatinine, Ser 09/17/2018 0.48  0.44 - 1.00 mg/dL Final   Calcium 75/64/3329 9.3  8.9 - 10.3 mg/dL Final   GFR calc non Af Amer 09/17/2018 >60  >60 mL/min Final   GFR calc Af Amer 09/17/2018 >60  >60 mL/min Final   Anion gap 09/17/2018 9  5 - 15 Final   Performed at [facility], [address], [place], [place] [address]   WBC 09/17/2018 5.3  4.0 -  10.5 K/uL Final   RBC 09/17/2018 4.31  3.87 - 5.11 MIL/uL Final   Hemoglobin 09/17/2018 13.2  12.0 - 15.0 g/dL Final   HCT 16/60/6301 40.7  36.0 - 46.0 % Final   MCV 09/17/2018 94.4  80.0 - 100.0 fL Final   MCH 09/17/2018 30.6  26.0 - 34.0 pg Final   MCHC 09/17/2018 32.4  30.0 - 36.0 g/dL Final   RDW 60/02/9322 12.6  11.5 - 15.5 % Final   Platelets 09/17/2018 149* 150 - 400 K/uL Final   nRBC 09/17/2018 0.0  0.0 - 0.2 % Final   Performed at [facility], [address], [place], [place] [address]   ABO/RH(D) 09/17/2018    Final  Value:A POS Performed at [facility], [address] [place], [place] [address]      X-Rays:Dg Pelvis Portable  Result Date: 09/21/2018 CLINICAL DATA:  Status post right hip replacement EXAM: PORTABLE PELVIS 1-2 VIEWS COMPARISON:  Intraoperative films from earlier in the same day. FINDINGS: Right hip prosthesis is noted in satisfactory position. Postoperative changes are noted in the adjacent soft tissues. 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 No other focal abnormality is noted. IMPRESSION: Status post right hip prosthesis without acute abnormality. Chronic changes in the left femoral head as described. Electronically Signed   By: Alcide Clever M.D.   On: 09/21/2018 10:06   Dg C-arm 1-60 Min-no Report  Result Date: 09/21/2018 Fluoroscopy was utilized by the requesting physician.  No radiographic interpretation.   Dg Hip Operative Unilat W Or W/o Pelvis Right  Result Date: 09/21/2018 CLINICAL DATA:  Right hip replacement EXAM: OPERATIVE RIGHT HIP (WITH PELVIS IF PERFORMED) 2 VIEWS TECHNIQUE: Fluoroscopic spot image(s) were submitted for interpretation post-operatively. FLUOROSCOPY TIME:  22 seconds COMPARISON:  None. FINDINGS: Two spot intraoperative fluoroscopic images the right hip are provided for review and demonstrate the sequela of ongoing  right total hip replacement. Alignment appears anatomic given anterior projection. There is a minimal amount of subcutaneous emphysema about the operative site. No radiopaque foreign body. IMPRESSION: Post right total hip replacement without evidence of complication. Electronically Signed   By: Simonne Come M.D.   On: 09/21/2018 10:01    EKG:No orders found for this or any previous visit.   Hospital Course: Mary House is a 63 y.o. who was admitted to [facility]. They were brought to the operating room on 09/21/2018 and underwent Procedure(s): TOTAL HIP ARTHROPLASTY ANTERIOR APPROACH.  Patient tolerated the procedure well and was later transferred to the recovery room and then to the orthopaedic floor for postoperative care. They were given PO and IV analgesics for pain control following their surgery. They were given 24 hours of postoperative antibiotics of  Anti-infectives (From admission, onward)   Start     Dose/Rate Route Frequency Ordered Stop   09/21/18 1400  ceFAZolin (ANCEF) IVPB 2g/100 mL premix     2 g 200 mL/hr over 30 Minutes Intravenous Every 6 hours 09/21/18 1220 09/21/18 2040   09/21/18 0600  ceFAZolin (ANCEF) IVPB 2g/100 mL premix     2 g 200 mL/hr over 30 Minutes Intravenous On call to O.R. 09/21/18 6045 09/21/18 4098     and started on DVT prophylaxis in the form of Aspirin.   PT and OT were ordered for total joint protocol. Discharge planning consulted to help with postop disposition and equipment needs.  Patient had a good night on the evening of surgery. They started to get up OOB with therapy on POD #0. Pt was seen during rounds and was ready to go home pending progress with therapy. She worked with therapy on POD #1 and was meeting her goals. Pt was discharged to home later that day in stable condition.  Diet: Regular diet Activity: WBAT Follow-up: in 2 weeks Disposition: Home Discharged Condition: good   Discharge Instructions    Call MD / Call 911    Complete by:  As directed    If you experience chest pain or shortness of breath, CALL 911 and be transported to the hospital emergency room.  If you develope a fever above 101 F, pus (white drainage) or increased drainage or redness at the  wound, or calf pain, call your surgeon's office.   Change dressing   Complete by:  As directed    Maintain surgical dressing until follow up in the clinic. If the edges start to pull up, may reinforce with tape. If the dressing is no longer working, may remove and cover with gauze and tape, but must keep the area dry and clean.  Call with any questions or concerns.   Change dressing   Complete by:  As directed    Maintain surgical dressing until follow up in the clinic. If the edges start to pull up, may reinforce with tape. If the dressing is no longer working, may remove and cover with gauze and tape, but must keep the area dry and clean.  Call with any questions or concerns.   Constipation Prevention   Complete by:  As directed    Drink plenty of fluids.  Prune juice may be helpful.  You may use a stool softener, such as Colace (over the counter) 100 mg twice a day.  Use MiraLax (over the counter) for constipation as needed.   Diet - low sodium heart healthy   Complete by:  As directed    Discharge instructions   Complete by:  As directed    Maintain surgical dressing until follow up in the clinic. If the edges start to pull up, may reinforce with tape. If the dressing is no longer working, may remove and cover with gauze and tape, but must keep the area dry and clean.  [facility] Call with any questions or concerns.   Increase activity slowly as tolerated   Complete by:  As directed    Weight bearing as tolerated with assist device (walker, cane, etc) as directed, use it as long as suggested by your surgeon or therapist, typically at least 4-6 weeks.   TED hose   Complete by:  As directed    Use stockings (TED hose)  for 2 weeks on both leg(s).  You may remove them at night for sleeping.   TED hose   Complete by:  As directed    Use stockings (TED hose) for 2 weeks on both leg(s).  You may remove them at night for sleeping.     Allergies as of 09/22/2018      Reactions   Tomato Other (See Comments)   Skin rash/psoriatic arthritis/inflammatory reaction   Ciprofloxacin Hives   Gluten Meal Swelling   Other Swelling   night shade: potato, tomatoes, and other raw vegetables: skin rash/ psoriatic arthritis/ inflammatory reaction   Penicillins Hives   Did it involve swelling of the face/tongue/throat, SOB, or low BP? No Did it involve sudden or severe rash/hives, skin peeling, or any reaction on the inside of your mouth or nose? No Did you need to seek medical attention at a hospital or doctor's office? Yes When did it last happen?childhood allergy If all above answers are NO, may proceed with cephalosporin use.   Sulfur Hives      Medication List    STOP taking these medications   acetaminophen-codeine 300-30 MG tablet Commonly known as:  TYLENOL #3   Diclofenac-miSOPROStol 75-0.2 MG Tbec   mupirocin ointment 2 % Commonly known as:  BACTROBAN     TAKE these medications   aspirin 81 MG chewable tablet Commonly known as:  Aspirin Childrens Chew 1 tablet (81 mg total) by mouth 2 (two) times daily for 30 days. Take for 4 weeks, then  resume regular dose.   buprenorphine 10 MCG/HR Ptwk patch Commonly known as:  BUTRANS Place 1 patch onto the skin every Thursday.   docusate sodium 100 MG capsule Commonly known as:  Colace Take 1 capsule (100 mg total) by mouth 2 (two) times daily.   ferrous sulfate 325 (65 FE) MG tablet Commonly known as:  FerrouSul Take 1 tablet (325 mg total) by mouth 3 (three) times daily with meals.   HYDROcodone-acetaminophen 7.5-325 MG tablet Commonly known as:  Norco Take 1-2 tablets by mouth every 4 (four) hours as needed for moderate pain.     methocarbamol 500 MG tablet Commonly known as:  Robaxin Take 1 tablet (500 mg total) by mouth every 6 (six) hours as needed for muscle spasms.   polyethylene glycol 17 g packet Commonly known as:  MIRALAX / GLYCOLAX Take 17 g by mouth 2 (two) times daily.   pregabalin 75 MG capsule Commonly known as:  LYRICA Take 75-150 mg by mouth See admin instructions. Take 75 mg in the morning and 150 mg in the evening            Discharge Care Instructions  (From admission, onward)         Start     Ordered   09/22/18 0000  Change dressing    Comments:  Maintain surgical dressing until follow up in the clinic. If the edges start to pull up, may reinforce with tape. If the dressing is no longer working, may remove and cover with gauze and tape, but must keep the area dry and clean.  Call with any questions or concerns.   09/22/18 1006   09/22/18 0000  Change dressing    Comments:  Maintain surgical dressing until follow up in the clinic. If the edges start to pull up, may reinforce with tape. If the dressing is no longer working, may remove and cover with gauze and tape, but must keep the area dry and clean.  Call with any questions or concerns.   09/22/18 1006         Follow-up Information    Durene Romans, MD. Schedule an appointment as soon as possible for a visit in 2 weeks.   Specialty:  Orthopedic Surgery Contact information: [address] [place] [address] [phone]           Signed: Dennie Bible, PA-C Orthopedic Surgery 09/22/2018, 12:57 PM

## 2018-09-22 NOTE — Progress Notes (Signed)
   09/22/18 1000  OT Visit Information  Last OT Received On 09/22/18  Assistance Needed +1  History of Present Illness 63 yo female s/p R THA-DA 5/12.   Precautions  Precautions Fall  Precaution Comments pt prefers shoes  Pain Assessment  Pain Score 4  Pain Location R hip  Pain Descriptors / Indicators Aching;Sore  Pain Intervention(s) Limited activity within patient's tolerance;Monitored during session;Premedicated before session;Repositioned  Cognition  Arousal/Alertness Awake/alert  Behavior During Therapy WFL for tasks assessed/performed  Overall Cognitive Status Within Functional Limits for tasks assessed  ADL  General ADL Comments practiced tub bench transfer.  Used 4 [employer] for brakes:  bench unsteady when she reached for back of seat and she could not reach hand low enough for bench to lower herself. Educated on cutting shower curtain liner to fit between panels.  Also educated that placing bench backwards might afford more space in her bathroom.  Pt still plans to have back grab bar placed so she can stand for peri area, if desired.  Used leg lifter to get leg over tub ledge  Bed Mobility  Supine to sit Modified independent (Device/Increase time)  General bed mobility comments extra time, no cues  Restrictions  Other Position/Activity Restrictions WBAT  Transfers  Equipment used 4-wheeled walker;Rolling walker (2 wheeled)  Sit to Stand Modified independent (Device/Increase time)  General transfer comment no cues needed  General Comments  General comments (skin integrity, edema, etc.) checked: friend allowed into gift shop to purchase AE  OT - End of Session  Activity Tolerance Patient tolerated treatment well  Patient left in chair (handed off to PT)  OT Assessment/Plan  OT Visit Diagnosis Pain  Pain - Right/Left Right  Pain - part of body Hip  Follow Up Recommendations Supervision - Intermittent  OT Equipment  (friend to get AE, 3:1 and tub bench)  AM-PAC OT "6 Clicks"  Daily Activity Outcome Measure (Version 2)  Help from another person eating meals? 4  Help from another person taking care of personal grooming? 3  Help from another person toileting, which includes using toliet, bedpan, or urinal? 3  Help from another person bathing (including washing, rinsing, drying)? 3  Help from another person to put on and taking off regular upper body clothing? 3  Help from another person to put on and taking off regular lower body clothing? 3  6 Click Score 19  OT Goal Progression  Progress towards OT goals Goals met/education completed, patient discharged from OT  OT Time Calculation  OT Start Time (ACUTE ONLY) 1002  OT Stop Time (ACUTE ONLY) 1034  OT Time Calculation (min) 32 min  OT General Charges  $OT Visit 1 Visit  OT Treatments  $Self Care/Home Management  8-22 mins  $Therapeutic Activity 8-22 mins  Lesle Chris, OTR/L Acute Rehabilitation Services [phone] [facility] pager [phone] office 09/22/2018

## 2018-09-22 NOTE — Progress Notes (Addendum)
   Subjective: 1 Day Post-Op Procedure(s) (LRB): TOTAL HIP ARTHROPLASTY ANTERIOR APPROACH (Right) Patient reports pain as mild.   Patient seen in rounds for Dr. Charlann Boxer. Patient is well, and has had no acute complaints or problems other than pain in the right hip. No acute events overnight. She states she is ready to go home today. Foley catheter removed, positive flatus. We will continue therapy today.   Objective: Vital signs in last 24 hours: Temp:  [97.4 F (36.3 C)-98 F (36.7 C)] 97.5 F (36.4 C) (05/13 0553) Pulse Rate:  [51-74] 61 (05/13 0553) Resp:  [10-16] 14 (05/13 0553) BP: (97-122)/(46-67) 116/54 (05/13 0553) SpO2:  [99 %-100 %] 100 % (05/13 0553) Weight:  [91.2 kg] 91.2 kg (05/12 1223)  Intake/Output from previous day:  Intake/Output Summary (Last 24 hours) at 09/22/2018 0825 Last data filed at 09/22/2018 0600 Gross per 24 hour  Intake 3796.62 ml  Output 4150 ml  Net -353.38 ml     Intake/Output this shift: No intake/output data recorded.  Labs: Recent Labs    09/22/18 0311  HGB 10.3*   Recent Labs    09/22/18 0311  WBC 10.2  RBC 3.32*  HCT 31.3*  PLT 135*   Recent Labs    09/22/18 0311  NA 137  K 3.7  CL 105  CO2 25  BUN 11  CREATININE 0.48  GLUCOSE 166*  CALCIUM 8.4*   No results for input(s): LABPT, INR in the last 72 hours.  Exam: General - Patient is Alert and Oriented Extremity - Neurologically intact Sensation intact distally Intact pulses distally Dorsiflexion/Plantar flexion intact Dressing - dressing C/D/I Motor Function - intact, moving foot and toes well on exam.   Past Medical History:  Diagnosis Date  . Arthritis     Assessment/Plan: 1 Day Post-Op Procedure(s) (LRB): TOTAL HIP ARTHROPLASTY ANTERIOR APPROACH (Right) Principal Problem:   S/P right THA, AA Active Problems:   Status post right hip replacement  Estimated body mass index is 32.45 kg/m as calculated from the following:   Height as of this encounter:  5\' 6"  (1.676 m).   Weight as of this encounter: 91.2 kg. Advance diet Up with therapy D/C IV fluids  DVT Prophylaxis - Aspirin Weight bearing as tolerated. D/C O2 and pulse ox and try on room air.   Plan is to go Home after hospital stay with HEP. Plan for discharge today after 1-2 sessions with therapy, as long as she is meeting her goals. We will hold her dose of decadron, as she states it sometimes causes a flare in her psoriatic arthritis. Follow up in the office with Dr. Charlann Boxer in 2 weeks.   Dennie Bible, PA-C Orthopedic Surgery 09/22/2018, 8:25 AM

## 2018-10-25 NOTE — H&P (Signed)
TOTAL HIP ADMISSION H&P  Patient is admitted for left total hip arthroplasty, anterior approach.  Subjective:  Chief Complaint: Left hip primary OA / pain  HPI: Mary House, 63 y.o. female, has a history of pain and functional disability in the left hip(s) due to arthritis and patient has failed non-surgical conservative treatments for greater than 12 weeks to include NSAID's and/or analgesics, corticosteriod injections, use of assistive devices and activity modification.  Onset of symptoms was gradual starting 1+ years ago with gradually worsening course since that time.The patient noted prior procedures of the hip to include arthroplasty on the right hip on (09/21/18).  Patient currently rates pain in the left hip at 6 out of 10 with activity. Patient has night pain, worsening of pain with activity and weight bearing, trendelenberg gait, pain that interfers with activities of daily living and pain with passive range of motion. Patient has evidence of periarticular osteophytes and joint space narrowing by imaging studies. This condition presents safety issues increasing the risk of falls.   There is no current active infection.  Risks, benefits and expectations were discussed with the patient.  Risks including but not limited to the risk of anesthesia, blood clots, nerve damage, blood vessel damage, failure of the prosthesis, infection and up to and including death.  Patient understand the risks, benefits and expectations and wishes to proceed with surgery.   PCP: System, Pcp Not In  D/C Plans:       Home   Post-op Meds:       No Rx given  Tranexamic Acid:      To be given - IV   Decadron:      Is to be given  FYI:      ASA  Norco  DME:   Pt already has equipment  PT:   [facility]  Pharmacy: [facility] [place]    Patient Active Problem List   Diagnosis Date Noted  . 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 Status post right hip replacement 09/21/2018   Past Medical  History:  Diagnosis Date  . Arthritis     Past Surgical History:  Procedure Laterality Date  . 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 CHOLECYSTECTOMY    . TOTAL HIP ARTHROPLASTY Right 09/21/2018   Procedure: TOTAL HIP ARTHROPLASTY ANTERIOR APPROACH;  Surgeon: Paralee Cancel, MD;  Location: [facility];  Service: Orthopedics;  Laterality: Right;  70 mins  . TUMOR REMOVAL     Cranial-Removed in college    No current facility-administered medications for this encounter.    Current Outpatient Medications  Medication Sig Dispense Refill Last Dose  . buprenorphine (BUTRANS) 10 MCG/HR PTWK patch Place 1 patch onto the skin every Thursday.   Past Week at Unknown time  . docusate sodium (COLACE) 100 MG capsule Take 1 capsule (100 mg total) by mouth 2 (two) times daily. 10 capsule 0   . ferrous sulfate (FERROUSUL) 325 (65 FE) MG tablet Take 1 tablet (325 mg total) by mouth 3 (three) times daily with meals.  3   . HYDROcodone-acetaminophen (NORCO) 7.5-325 MG tablet Take 1-2 tablets by mouth every 4 (four) hours as needed for moderate pain. 60 tablet 0   . methocarbamol (ROBAXIN) 500 MG tablet Take 1 tablet (500 mg total) by mouth every 6 (six) hours as needed for muscle spasms. 40 tablet 0   . polyethylene glycol (MIRALAX / GLYCOLAX) 17 g packet Take 17 g by mouth  2 (two) times daily. 14 each 0   . pregabalin (LYRICA) 75 MG capsule Take 75-150 mg by mouth See admin instructions. Take 75 mg in the morning and 150 mg in the evening   09/20/2018 at Unknown time   Allergies  Allergen Reactions  . Tomato Other (See Comments)    Skin rash/psoriatic arthritis/inflammatory reaction  . Ciprofloxacin Hives  . Gluten Meal Swelling  . Other Swelling    night shade: potato, tomatoes, and other raw vegetables: skin rash/ psoriatic arthritis/ inflammatory reaction  . Penicillins Hives    Did it involve swelling of the face/tongue/throat, SOB, or low BP? No Did it involve sudden or severe rash/hives, skin  peeling, or any reaction on the inside of your mouth or nose? No Did you need to seek medical attention at a hospital or doctor's office? Yes When did it last happen?childhood allergy If all above answers are "NO", may proceed with cephalosporin use.   Lavera Guise. Sulfur Hives    Social History   Tobacco Use  . Smoking status: Never Smoker  . Smokeless tobacco: Never Used  Substance Use Topics  . Alcohol use: Never    Frequency: Never       Review of Systems  Constitutional: Negative.   HENT: Negative.   Eyes: Negative.   Respiratory: Negative.   Cardiovascular: Negative.   Gastrointestinal: Negative.   Genitourinary: Negative.   Musculoskeletal: Positive for joint pain.  Neurological: Negative.   Endo/Heme/Allergies: Negative.   Psychiatric/Behavioral: Negative.     Objective:  Physical Exam  Constitutional: She is oriented to person, place, and time. She appears well-developed.  HENT:  Head: Normocephalic.  Eyes: Pupils are equal, round, and reactive to light.  Neck: Neck supple. No JVD present. No tracheal deviation present. No thyromegaly present.  Cardiovascular: Normal rate, regular rhythm and intact distal pulses.  Respiratory: Effort normal and breath sounds normal. No respiratory distress. She has no wheezes.  GI: Soft. There is no abdominal tenderness. There is no guarding.  Musculoskeletal:     Left hip: She exhibits decreased range of motion, decreased strength, tenderness and bony tenderness. She exhibits no swelling, no deformity and no laceration.  Lymphadenopathy:    She has no cervical adenopathy.  Neurological: She is alert and oriented to person, place, and time. A sensory deficit (right LE numbness, back) is present.  Skin: Skin is warm and dry.  Psychiatric: She has a normal mood and affect.      Labs:  Estimated body mass index is 32.45 kg/m as calculated from the following:   Height as of 09/21/18: 5\' 6"  (1.676 m).   Weight as of 09/21/18:  91.2 kg.   Imaging Review Plain radiographs demonstrate severe degenerative joint disease of the left hip. The bone quality appears to be good for age and reported activity level.      Assessment/Plan:  End stage arthritis, left hip  The patient history, physical examination, clinical judgement of the provider and imaging studies are consistent with end stage degenerative joint disease of the left hip(s) and total hip arthroplasty is deemed medically necessary. The treatment options including medical management, injection therapy, arthroscopy and arthroplasty were discussed at length. The risks and benefits of total hip arthroplasty were presented and reviewed. The risks due to aseptic loosening, infection, stiffness, dislocation/subluxation,  thromboembolic complications and other imponderables were discussed.  The patient acknowledged the explanation, agreed to proceed with the plan and consent was signed. Patient is being admitted for inpatient treatment for surgery,  pain control, PT, OT, prophylactic antibiotics, VTE prophylaxis, progressive ambulation and ADL's and discharge planning.The patient is planning to be discharged home      Anastasio AuerbachMatthew S. Vicky Schleich   PA-C  10/25/2018, 11:21 AM

## 2018-10-28 NOTE — Patient Instructions (Addendum)
Mary House     Your procedure is scheduled on: 11-04-2018   Report to [facility] Main  Entrance    Report to admitting at [facility] TEST ON 11-01-18 @11 :00 AM, THIS TEST MUST BE DONE BEFORE SURGERY, COME TO [place]. ONCE YOUR COVID TEST IS COMPLETED, PLEASE BEGIN THE QUARANTINE INSTRUCTIONS AS OUTLINED IN YOUR HANDOUT.   Call this number if you have problems the morning of surgery [phone]    Remember:  NO SOLID FOOD AFTER MIDNIGHT THE NIGHT PRIOR TO SURGERY. NOTHING BY MOUTH EXCEPT CLEAR LIQUIDS UNTIL 600 AM. . PLEASE FINISH ENSURE DRINK PER SURGEON ORDER 3 HOURS PRIOR TO SCHEDULED SURGERY TIME WHICH NEEDS TO BE COMPLETED AT 600 AM.   CLEAR LIQUID DIET   Foods Allowed                                                                     Foods Excluded  Coffee and tea, regular and decaf                             liquids that you cannot  Plain Jell-O in any flavor                                             see through such as: Fruit ices (not with fruit pulp)                                     milk, soups, orange juice  Iced Popsicles                                    All solid food Carbonated beverages, regular and diet                                    Cranberry, grape and apple juices Sports drinks like Gatorade Lightly seasoned clear broth or consume(fat free) Sugar, honey syrup  Sample Menu Breakfast                                Lunch                                     Supper Cranberry juice                    Beef broth                            Chicken broth Jell-O  Grape juice                           Apple juice Coffee or tea                        Jell-O                                      Popsicle                                                Coffee or tea                        Coffee or  tea  _____________________________________________________________________     Take these medicines the morning of surgery with A SIP OF WATER: NONE   BRUSH YOUR TEETH MORNING OF SURGERY AND RINSE YOUR MOUTH OUT, NO CHEWING GUM CANDY OR MINTS.               You may not have any metal on your body including hair pins and              piercings  Do not wear jewelry, make-up, lotions, powders or perfumes, deodorant             Do not wear nail polish.  Do not shave  48 hours prior to surgery.                 Do not bring valuables to the hospital. [facility] IS NOT             RESPONSIBLE   FOR VALUABLES.  Contacts, dentures or bridgework may not be worn into surgery.       _____________________________________________________________________             [facility] Before surgery, you can play an important role.  Because skin is not sterile, your skin needs to be as free of germs as possible.  You can reduce the number of germs on your skin by washing with CHG (chlorahexidine gluconate) soap before surgery.  CHG is an antiseptic cleaner which kills germs and bonds with the skin to continue killing germs even after washing. Please DO NOT use if you have an allergy to CHG or antibacterial soaps.  If your skin becomes reddened/irritated stop using the CHG and inform your nurse when you arrive at Short Stay. Do not shave (including legs and underarms) for at least 48 hours prior to the first CHG shower.  You may shave your face/neck. Please follow these instructions carefully:  1.  Shower with CHG Soap the night before surgery and the  morning of Surgery.  2.  If you choose to wash your hair, wash your hair first as usual with your  normal  shampoo.  3.  After you shampoo, rinse your hair and body thoroughly to remove the  shampoo.                           4.  Use CHG as you would any other liquid soap.  You can apply chg directly  to the  skin and wash                        Gently with a scrungie or clean washcloth.  5.  Apply the CHG Soap to your body ONLY FROM THE NECK DOWN.   Do not use on face/ open                           Wound or open sores. Avoid contact with eyes, ears mouth and genitals (private parts).                       Wash face,  Genitals (private parts) with your normal soap.             6.  Wash thoroughly, paying special attention to the area where your surgery  will be performed.  7.  Thoroughly rinse your body with warm water from the neck down.  8.  DO NOT shower/wash with your normal soap after using and rinsing off  the CHG Soap.                9.  Pat yourself dry with a clean towel.            10.  Wear clean pajamas.            11.  Place clean sheets on your bed the night of your first shower and do not  sleep with pets. Day of Surgery : Do not apply any lotions/deodorants the morning of surgery.  Please wear clean clothes to the hospital/surgery center.  FAILURE TO FOLLOW THESE INSTRUCTIONS MAY RESULT IN THE CANCELLATION OF YOUR SURGERY PATIENT SIGNATURE_________________________________  NURSE SIGNATURE__________________________________  ________________________________________________________________________   Mary MireIncentive Spirometer  An incentive spirometer is a tool that can help keep your lungs clear and active. This tool measures how well you are filling your lungs with each breath. Taking long deep breaths may help reverse or decrease the chance of developing breathing (pulmonary) problems (especially infection) following:  A long period of time when you are unable to move or be active. BEFORE THE PROCEDURE   If the spirometer includes an indicator to show your best effort, your nurse or respiratory therapist will set it to a desired goal.  If possible, sit up straight or lean slightly forward. Try not to slouch.  Hold the incentive spirometer in an upright position. INSTRUCTIONS FOR USE  1. Sit on the  edge of your bed if possible, or sit up as far as you can in bed or on a chair. 2. Hold the incentive spirometer in an upright position. 3. Breathe out normally. 4. Place the mouthpiece in your mouth and seal your lips tightly around it. 5. Breathe in slowly and as deeply as possible, raising the piston or the ball toward the top of the column. 6. Hold your breath for 3-5 seconds or for as long as possible. Allow the piston or ball to fall to the bottom of the column. 7. Remove the mouthpiece from your mouth and breathe out normally. 8. Rest for a few seconds and repeat Steps 1 through 7 at least 10 times every 1-2 hours when you are awake. Take your time and take a few normal breaths between deep breaths. 9. The spirometer may include an indicator to show your best effort. Use the indicator as a goal to work toward during each repetition. 10. After each set  of 10 deep breaths, practice coughing to be sure your lungs are clear. If you have an incision (the cut made at the time of surgery), support your incision when coughing by placing a pillow or rolled up towels firmly against it. Once you are able to get out of bed, walk around indoors and cough well. You may stop using the incentive spirometer when instructed by your caregiver.  RISKS AND COMPLICATIONS  Take your time so you do not get dizzy or light-headed.  If you are in pain, you may need to take or ask for pain medication before doing incentive spirometry. It is harder to take a deep breath if you are having pain. AFTER USE  Rest and breathe slowly and easily.  It can be helpful to keep track of a log of your progress. Your caregiver can provide you with a simple table to help with this. If you are using the spirometer at home, follow these instructions: SEEK MEDICAL CARE IF:   You are having difficultly using the spirometer.  You have trouble using the spirometer as often as instructed.  Your pain medication is not giving enough  relief while using the spirometer.  You develop fever of 100.5 F (38.1 C) or higher. SEEK IMMEDIATE MEDICAL CARE IF:   You cough up bloody sputum that had not been present before.  You develop fever of 102 F (38.9 C) or greater.  You develop worsening pain at or near the incision site. MAKE SURE YOU:   Understand these instructions.  Will watch your condition.  Will get help right away if you are not doing well or get worse. Document Released: 09/08/2006 Document Revised: 07/21/2011 Document Reviewed: 11/09/2006 ExitCare Patient Information 2014 ExitCare, [place].   ________________________________________________________________________  WHAT IS A BLOOD TRANSFUSION? Blood Transfusion Information  A transfusion is the replacement of blood or some of its parts. Blood is made up of multiple cells which provide different functions.  Red blood cells carry oxygen and are used for blood loss replacement.  White blood cells fight against infection.  Platelets control bleeding.  Plasma helps clot blood.  Other blood products are available for specialized needs, such as hemophilia or other clotting disorders. BEFORE THE TRANSFUSION  Who gives blood for transfusions?   Healthy volunteers who are fully evaluated to make sure their blood is safe. This is blood bank blood. Transfusion therapy is the safest it has ever been in the practice of medicine. Before blood is taken from a donor, a complete history is taken to make sure that person has no history of diseases nor engages in risky social behavior (examples are intravenous drug use or sexual activity with multiple partners). The donor's travel history is screened to minimize risk of transmitting infections, such as malaria. The donated blood is tested for signs of infectious diseases, such as HIV and hepatitis. The blood is then tested to be sure it is compatible with you in order to minimize the chance of a transfusion reaction. If  you or a relative donates blood, this is often done in anticipation of surgery and is not appropriate for emergency situations. It takes many days to process the donated blood. RISKS AND COMPLICATIONS Although transfusion therapy is very safe and saves many lives, the main dangers of transfusion include:   Getting an infectious disease.  Developing a transfusion reaction. This is an allergic reaction to something in the blood you were given. Every precaution is taken to prevent this. The decision to have a  blood transfusion has been considered carefully by your caregiver before blood is given. Blood is not given unless the benefits outweigh the risks. AFTER THE TRANSFUSION  Right after receiving a blood transfusion, you will usually feel much better and more energetic. This is especially true if your red blood cells have gotten low (anemic). The transfusion raises the level of the red blood cells which carry oxygen, and this usually causes an energy increase.  The nurse administering the transfusion will monitor you carefully for complications. HOME CARE INSTRUCTIONS  No special instructions are needed after a transfusion. You may find your energy is better. Speak with your caregiver about any limitations on activity for underlying diseases you may have. SEEK MEDICAL CARE IF:   Your condition is not improving after your transfusion.  You develop redness or irritation at the intravenous (IV) site. SEEK IMMEDIATE MEDICAL CARE IF:  Any of the following symptoms occur over the next 12 hours:  Shaking chills.  You have a temperature by mouth above 102 F (38.9 C), not controlled by medicine.  Chest, back, or muscle pain.  People around you feel you are not acting correctly or are confused.  Shortness of breath or difficulty breathing.  Dizziness and fainting.  You get a rash or develop hives.  You have a decrease in urine output.  Your urine turns a dark color or changes to pink,  red, or brown. Any of the following symptoms occur over the next 10 days:  You have a temperature by mouth above 102 F (38.9 C), not controlled by medicine.  Shortness of breath.  Weakness after normal activity.  The white part of the eye turns yellow (jaundice).  You have a decrease in the amount of urine or are urinating less often.  Your urine turns a dark color or changes to pink, red, or brown. Document Released: 04/25/2000 Document Revised: 07/21/2011 Document Reviewed: 12/13/2007 [facility] Patient Information 2014 [place], [place].  _______________________________________________________________________

## 2018-10-29 ENCOUNTER — Encounter (INDEPENDENT_AMBULATORY_CARE_PROVIDER_SITE_OTHER): Payer: Self-pay

## 2018-10-29 ENCOUNTER — Encounter (HOSPITAL_COMMUNITY)
Admission: RE | Admit: 2018-10-29 | Discharge: 2018-10-29 | Disposition: A | Discharge status: Home / Self Care | Payer: Commercial Managed Care - PPO | Source: Ambulatory Visit | Attending: Orthopedic Surgery | Admitting: Orthopedic Surgery

## 2018-10-29 ENCOUNTER — Other Ambulatory Visit: Payer: Self-pay

## 2018-10-29 ENCOUNTER — Encounter (HOSPITAL_COMMUNITY): Payer: Self-pay

## 2018-10-29 DIAGNOSIS — Z01812 Encounter for preprocedural laboratory examination: Secondary | ICD-10-CM | POA: Diagnosis present

## 2018-10-29 DIAGNOSIS — M1612 Unilateral primary osteoarthritis, left hip: Secondary | ICD-10-CM | POA: Insufficient documentation

## 2018-10-29 DIAGNOSIS — Z1159 Encounter for screening for other viral diseases: Secondary | ICD-10-CM | POA: Insufficient documentation

## 2018-10-29 LAB — CBC
HCT: 41.5 % (ref 36.0–46.0)
Hemoglobin: 13.2 g/dL (ref 12.0–15.0)
MCH: 29.9 pg (ref 26.0–34.0)
MCHC: 31.8 g/dL (ref 30.0–36.0)
MCV: 94.1 fL (ref 80.0–100.0)
Platelets: 148 10*3/uL — ABNORMAL LOW (ref 150–400)
RBC: 4.41 MIL/uL (ref 3.87–5.11)
RDW: 12.9 % (ref 11.5–15.5)
WBC: 6.5 10*3/uL (ref 4.0–10.5)
nRBC: 0 % (ref 0.0–0.2)

## 2018-10-29 LAB — SURGICAL PCR SCREEN
MRSA, PCR: NEGATIVE
Staphylococcus aureus: NEGATIVE

## 2018-11-01 ENCOUNTER — Other Ambulatory Visit (HOSPITAL_COMMUNITY)
Admission: RE | Admit: 2018-11-01 | Discharge: 2018-11-01 | Disposition: A | Discharge status: Home / Self Care | Payer: Commercial Managed Care - PPO | Source: Ambulatory Visit | Attending: Orthopedic Surgery | Admitting: Orthopedic Surgery

## 2018-11-01 DIAGNOSIS — Z01812 Encounter for preprocedural laboratory examination: Secondary | ICD-10-CM | POA: Diagnosis not present

## 2018-11-01 LAB — SARS CORONAVIRUS 2 (TAT 6-24 HRS): SARS Coronavirus 2: NEGATIVE

## 2018-11-03 NOTE — Anesthesia Preprocedure Evaluation (Signed)
Anesthesia Evaluation  Patient identified by MRN, date of birth, ID band Patient awake    Reviewed: Allergy & Precautions, NPO status , Patient's Chart, lab work & pertinent test results  Airway Mallampati: II  TM Distance: >3 FB Neck ROM: Full    Dental no notable dental hx.    Pulmonary neg pulmonary ROS,    Pulmonary exam normal breath sounds clear to auscultation       Cardiovascular negative cardio ROS Normal cardiovascular exam Rhythm:Regular Rate:Normal     Neuro/Psych negative neurological ROS  negative psych ROS   GI/Hepatic negative GI ROS, Neg liver ROS,   Endo/Other  negative endocrine ROS  Renal/GU negative Renal ROS  negative genitourinary   Musculoskeletal  (+) Arthritis , Osteoarthritis,    Abdominal   Peds negative pediatric ROS (+)  Hematology negative hematology ROS (+)   Anesthesia Other Findings   Reproductive/Obstetrics negative OB ROS                             Anesthesia Physical  Anesthesia Plan  ASA: II  Anesthesia Plan: Spinal   Post-op Pain Management:    Induction:   PONV Risk Score and Plan: 2 and Ondansetron and Treatment may vary due to age or medical condition  Airway Management Planned: Simple Face Mask and Nasal Cannula  Additional Equipment:   Intra-op Plan:   Post-operative Plan:   Informed Consent: I have reviewed the patients History and Physical, chart, labs and discussed the procedure including the risks, benefits and alternatives for the proposed anesthesia with the patient or authorized representative who has indicated his/her understanding and acceptance.     Dental advisory given  Plan Discussed with: CRNA, Anesthesiologist and Surgeon  Anesthesia Plan Comments: (See PAT note 09/17/2018, Konrad Felix, PA-C)        Anesthesia Quick Evaluation

## 2018-11-04 ENCOUNTER — Inpatient Hospital Stay (HOSPITAL_COMMUNITY): Payer: Commercial Managed Care - PPO

## 2018-11-04 ENCOUNTER — Other Ambulatory Visit: Payer: Self-pay

## 2018-11-04 ENCOUNTER — Encounter (HOSPITAL_COMMUNITY): Payer: Self-pay | Admitting: Registered Nurse

## 2018-11-04 ENCOUNTER — Inpatient Hospital Stay (HOSPITAL_COMMUNITY)
Admission: RE | Admit: 2018-11-04 | Discharge: 2018-11-05 | DRG: 470 | Disposition: A | Discharge status: Home / Self Care | Payer: Commercial Managed Care - PPO | Attending: Orthopedic Surgery | Admitting: Orthopedic Surgery

## 2018-11-04 ENCOUNTER — Inpatient Hospital Stay (HOSPITAL_COMMUNITY): Payer: Commercial Managed Care - PPO | Admitting: Physician Assistant

## 2018-11-04 ENCOUNTER — Encounter (HOSPITAL_COMMUNITY)
Admission: RE | Disposition: A | Discharge status: Home / Self Care | Payer: Self-pay | Source: Other Acute Inpatient Hospital | Attending: Orthopedic Surgery

## 2018-11-04 ENCOUNTER — Inpatient Hospital Stay (HOSPITAL_COMMUNITY): Payer: Commercial Managed Care - PPO | Admitting: Anesthesiology

## 2018-11-04 DIAGNOSIS — M1612 Unilateral primary osteoarthritis, left hip: Secondary | ICD-10-CM | POA: Diagnosis present

## 2018-11-04 DIAGNOSIS — Z96642 Presence of left artificial hip joint: Secondary | ICD-10-CM

## 2018-11-04 DIAGNOSIS — Z96649 Presence of unspecified artificial hip joint: Secondary | ICD-10-CM

## 2018-11-04 DIAGNOSIS — Z419 Encounter for procedure for purposes other than remedying health state, unspecified: Secondary | ICD-10-CM

## 2018-11-04 HISTORY — PX: TOTAL HIP ARTHROPLASTY: SHX124

## 2018-11-04 LAB — TYPE AND SCREEN
ABO/RH(D): A POS
Antibody Screen: NEGATIVE

## 2018-11-04 SURGERY — ARTHROPLASTY, HIP, TOTAL, ANTERIOR APPROACH
Anesthesia: Spinal | Site: Hip | Laterality: Left

## 2018-11-04 MED ORDER — PHENYLEPHRINE 40 MCG/ML (10ML) SYRINGE FOR IV PUSH (FOR BLOOD PRESSURE SUPPORT)
PREFILLED_SYRINGE | INTRAVENOUS | Status: AC
  Filled 2018-11-04: qty 10

## 2018-11-04 MED ORDER — PROPOFOL 10 MG/ML IV BOLUS
INTRAVENOUS | Status: DC | PRN
  Administered 2018-11-04: 30 mg via INTRAVENOUS

## 2018-11-04 MED ORDER — DIPHENHYDRAMINE HCL 12.5 MG/5ML PO ELIX: 12.5000 mg | ORAL_SOLUTION | ORAL | Status: DC | PRN

## 2018-11-04 MED ORDER — DOCUSATE SODIUM 100 MG PO CAPS
100.0000 mg | ORAL_CAPSULE | Freq: Two times a day (BID) | ORAL | Status: DC
  Administered 2018-11-04 – 2018-11-05 (×2): 100 mg via ORAL
  Filled 2018-11-04 (×2): qty 1

## 2018-11-04 MED ORDER — ONDANSETRON HCL 4 MG/2ML IJ SOLN
INTRAMUSCULAR | Status: AC
  Filled 2018-11-04: qty 2

## 2018-11-04 MED ORDER — METHOCARBAMOL 500 MG PO TABS
500.0000 mg | ORAL_TABLET | Freq: Four times a day (QID) | ORAL | Status: DC | PRN
  Administered 2018-11-04 – 2018-11-05 (×3): 500 mg via ORAL
  Filled 2018-11-04 (×3): qty 1

## 2018-11-04 MED ORDER — PREGABALIN 75 MG PO CAPS
75.0000 mg | ORAL_CAPSULE | Freq: Every day | ORAL | Status: DC
  Administered 2018-11-05: 75 mg via ORAL
  Filled 2018-11-04: qty 1

## 2018-11-04 MED ORDER — CHLORHEXIDINE GLUCONATE 4 % EX LIQD: 60.0000 mL | Freq: Once | CUTANEOUS | Status: DC

## 2018-11-04 MED ORDER — PROPOFOL 500 MG/50ML IV EMUL
INTRAVENOUS | Status: DC | PRN
  Administered 2018-11-04: 75 ug/kg/min via INTRAVENOUS

## 2018-11-04 MED ORDER — PROPOFOL 10 MG/ML IV BOLUS
INTRAVENOUS | Status: AC
  Filled 2018-11-04: qty 60

## 2018-11-04 MED ORDER — ACETAMINOPHEN 325 MG PO TABS: 325.0000 mg | ORAL_TABLET | ORAL | Status: DC | PRN

## 2018-11-04 MED ORDER — FENTANYL CITRATE (PF) 100 MCG/2ML IJ SOLN
INTRAMUSCULAR | Status: AC
  Filled 2018-11-04: qty 2

## 2018-11-04 MED ORDER — ALUM & MAG HYDROXIDE-SIMETH 200-200-20 MG/5ML PO SUSP: 15.0000 mL | ORAL | Status: DC | PRN

## 2018-11-04 MED ORDER — ACETAMINOPHEN 325 MG PO TABS: 325.0000 mg | ORAL_TABLET | Freq: Four times a day (QID) | ORAL | Status: DC | PRN

## 2018-11-04 MED ORDER — METHOCARBAMOL 500 MG IVPB - SIMPLE MED
500.0000 mg | Freq: Four times a day (QID) | INTRAVENOUS | Status: DC | PRN
  Filled 2018-11-04: qty 50

## 2018-11-04 MED ORDER — ONDANSETRON HCL 4 MG/2ML IJ SOLN: 4.0000 mg | Freq: Once | INTRAMUSCULAR | Status: DC | PRN

## 2018-11-04 MED ORDER — METOCLOPRAMIDE HCL 5 MG PO TABS: 5.0000 mg | ORAL_TABLET | Freq: Three times a day (TID) | ORAL | Status: DC | PRN

## 2018-11-04 MED ORDER — FENTANYL CITRATE (PF) 100 MCG/2ML IJ SOLN
INTRAMUSCULAR | Status: DC | PRN
  Administered 2018-11-04: 50 ug via INTRAVENOUS

## 2018-11-04 MED ORDER — LACTATED RINGERS IV SOLN
INTRAVENOUS | Status: DC
  Administered 2018-11-04 (×3): via INTRAVENOUS

## 2018-11-04 MED ORDER — ONDANSETRON HCL 4 MG/2ML IJ SOLN: 4.0000 mg | Freq: Four times a day (QID) | INTRAMUSCULAR | Status: DC | PRN

## 2018-11-04 MED ORDER — MENTHOL 3 MG MT LOZG
1.0000 | LOZENGE | OROMUCOSAL | Status: DC | PRN
  Administered 2018-11-04: 3 mg via ORAL
  Filled 2018-11-04: qty 9

## 2018-11-04 MED ORDER — HYDROCODONE-ACETAMINOPHEN 7.5-325 MG PO TABS
1.0000 | ORAL_TABLET | ORAL | Status: DC | PRN
  Administered 2018-11-04: 1 via ORAL
  Administered 2018-11-04: 2 via ORAL
  Administered 2018-11-04 – 2018-11-05 (×2): 1 via ORAL
  Administered 2018-11-05 (×2): 2 via ORAL
  Filled 2018-11-04: qty 2
  Filled 2018-11-04 (×3): qty 1
  Filled 2018-11-04: qty 2
  Filled 2018-11-04: qty 1

## 2018-11-04 MED ORDER — MIDAZOLAM HCL 5 MG/5ML IJ SOLN
INTRAMUSCULAR | Status: DC | PRN
  Administered 2018-11-04: 2 mg via INTRAVENOUS

## 2018-11-04 MED ORDER — DEXAMETHASONE SODIUM PHOSPHATE 10 MG/ML IJ SOLN
10.0000 mg | Freq: Once | INTRAMUSCULAR | Status: AC
  Administered 2018-11-05: 10 mg via INTRAVENOUS
  Filled 2018-11-04: qty 1

## 2018-11-04 MED ORDER — SODIUM CHLORIDE 0.9 % IV SOLN
INTRAVENOUS | Status: DC
  Administered 2018-11-04: 13:00:00 via INTRAVENOUS

## 2018-11-04 MED ORDER — ASPIRIN 81 MG PO CHEW
81.0000 mg | CHEWABLE_TABLET | Freq: Two times a day (BID) | ORAL | Status: DC
  Administered 2018-11-04 – 2018-11-05 (×2): 81 mg via ORAL
  Filled 2018-11-04 (×2): qty 1

## 2018-11-04 MED ORDER — BISACODYL 10 MG RE SUPP: 10.0000 mg | Freq: Every day | RECTAL | Status: DC | PRN

## 2018-11-04 MED ORDER — MEPERIDINE HCL 50 MG/ML IJ SOLN: 6.2500 mg | INTRAMUSCULAR | Status: DC | PRN

## 2018-11-04 MED ORDER — TRAMADOL HCL 50 MG PO TABS
50.0000 mg | ORAL_TABLET | Freq: Four times a day (QID) | ORAL | Status: DC
  Administered 2018-11-04 – 2018-11-05 (×3): 50 mg via ORAL
  Filled 2018-11-04 (×3): qty 1

## 2018-11-04 MED ORDER — PHENYLEPHRINE HCL (PRESSORS) 10 MG/ML IV SOLN
INTRAVENOUS | Status: DC | PRN
  Administered 2018-11-04: 160 ug via INTRAVENOUS
  Administered 2018-11-04: 80 ug via INTRAVENOUS
  Administered 2018-11-04: 160 ug via INTRAVENOUS

## 2018-11-04 MED ORDER — TRANEXAMIC ACID-NACL 1000-0.7 MG/100ML-% IV SOLN
1000.0000 mg | Freq: Once | INTRAVENOUS | Status: AC
  Administered 2018-11-04: 1000 mg via INTRAVENOUS
  Filled 2018-11-04: qty 100

## 2018-11-04 MED ORDER — TRANEXAMIC ACID-NACL 1000-0.7 MG/100ML-% IV SOLN
1000.0000 mg | INTRAVENOUS | Status: AC
  Administered 2018-11-04: 1000 mg via INTRAVENOUS
  Filled 2018-11-04: qty 100

## 2018-11-04 MED ORDER — MAGNESIUM CITRATE PO SOLN: 1.0000 | Freq: Once | ORAL | Status: DC | PRN

## 2018-11-04 MED ORDER — DEXAMETHASONE SODIUM PHOSPHATE 10 MG/ML IJ SOLN
10.0000 mg | Freq: Once | INTRAMUSCULAR | Status: AC
  Administered 2018-11-04: 10 mg via INTRAVENOUS

## 2018-11-04 MED ORDER — FENTANYL CITRATE (PF) 100 MCG/2ML IJ SOLN: 25.0000 ug | INTRAMUSCULAR | Status: DC | PRN

## 2018-11-04 MED ORDER — CEFAZOLIN SODIUM-DEXTROSE 2-4 GM/100ML-% IV SOLN
2.0000 g | Freq: Four times a day (QID) | INTRAVENOUS | Status: AC
  Administered 2018-11-04 (×2): 2 g via INTRAVENOUS
  Filled 2018-11-04 (×2): qty 100

## 2018-11-04 MED ORDER — METOCLOPRAMIDE HCL 5 MG/ML IJ SOLN: 5.0000 mg | Freq: Three times a day (TID) | INTRAMUSCULAR | Status: DC | PRN

## 2018-11-04 MED ORDER — OXYCODONE HCL 5 MG/5ML PO SOLN: 5.0000 mg | Freq: Once | ORAL | Status: DC | PRN

## 2018-11-04 MED ORDER — ACETAMINOPHEN 160 MG/5ML PO SOLN: 325.0000 mg | ORAL | Status: DC | PRN

## 2018-11-04 MED ORDER — BUPIVACAINE IN DEXTROSE 0.75-8.25 % IT SOLN
INTRATHECAL | Status: DC | PRN
  Administered 2018-11-04: 2 mL via INTRATHECAL

## 2018-11-04 MED ORDER — DEXAMETHASONE SODIUM PHOSPHATE 10 MG/ML IJ SOLN
INTRAMUSCULAR | Status: AC
  Filled 2018-11-04: qty 1

## 2018-11-04 MED ORDER — PHENYLEPHRINE HCL (PRESSORS) 10 MG/ML IV SOLN
INTRAVENOUS | Status: AC
  Filled 2018-11-04: qty 1

## 2018-11-04 MED ORDER — CEFAZOLIN SODIUM-DEXTROSE 2-4 GM/100ML-% IV SOLN
2.0000 g | INTRAVENOUS | Status: AC
  Administered 2018-11-04: 2 g via INTRAVENOUS
  Filled 2018-11-04: qty 100

## 2018-11-04 MED ORDER — HYDROMORPHONE HCL 1 MG/ML IJ SOLN: 0.5000 mg | INTRAMUSCULAR | Status: DC | PRN

## 2018-11-04 MED ORDER — ONDANSETRON HCL 4 MG PO TABS: 4.0000 mg | ORAL_TABLET | Freq: Four times a day (QID) | ORAL | Status: DC | PRN

## 2018-11-04 MED ORDER — SODIUM CHLORIDE 0.9 % IR SOLN
Status: DC | PRN
  Administered 2018-11-04: 1000 mL

## 2018-11-04 MED ORDER — FERROUS SULFATE 325 (65 FE) MG PO TABS
325.0000 mg | ORAL_TABLET | Freq: Three times a day (TID) | ORAL | Status: DC
  Administered 2018-11-05: 325 mg via ORAL
  Filled 2018-11-04: qty 1

## 2018-11-04 MED ORDER — PHENOL 1.4 % MT LIQD: 1.0000 | OROMUCOSAL | Status: DC | PRN

## 2018-11-04 MED ORDER — OXYCODONE HCL 5 MG PO TABS: 5.0000 mg | ORAL_TABLET | Freq: Once | ORAL | Status: DC | PRN

## 2018-11-04 MED ORDER — PREGABALIN 75 MG PO CAPS
150.0000 mg | ORAL_CAPSULE | Freq: Every day | ORAL | Status: DC
  Administered 2018-11-04: 150 mg via ORAL
  Filled 2018-11-04: qty 2

## 2018-11-04 MED ORDER — SODIUM CHLORIDE 0.9 % IV SOLN
INTRAVENOUS | Status: DC | PRN
  Administered 2018-11-04: 25 ug/min via INTRAVENOUS

## 2018-11-04 MED ORDER — MIDAZOLAM HCL 2 MG/2ML IJ SOLN
INTRAMUSCULAR | Status: AC
  Filled 2018-11-04: qty 2

## 2018-11-04 MED ORDER — HYDROCODONE-ACETAMINOPHEN 5-325 MG PO TABS: 1.0000 | ORAL_TABLET | ORAL | Status: DC | PRN

## 2018-11-04 MED ORDER — STERILE WATER FOR IRRIGATION IR SOLN
Status: DC | PRN
  Administered 2018-11-04 (×2): 1000 mL

## 2018-11-04 MED ORDER — POLYETHYLENE GLYCOL 3350 17 G PO PACK
17.0000 g | PACK | Freq: Two times a day (BID) | ORAL | Status: DC
  Administered 2018-11-04: 17 g via ORAL
  Filled 2018-11-04 (×2): qty 1

## 2018-11-04 SURGICAL SUPPLY — 43 items
BAG DECANTER FOR FLEXI CONT (MISCELLANEOUS) IMPLANT
BAG ZIPLOCK 12X15 (MISCELLANEOUS) IMPLANT
BLADE SAG 18X100X1.27 (BLADE) ×3 IMPLANT
BLADE SURG SZ10 CARB STEEL (BLADE) ×6 IMPLANT
COVER PERINEAL POST (MISCELLANEOUS) ×3 IMPLANT
COVER SURGICAL LIGHT HANDLE (MISCELLANEOUS) ×3 IMPLANT
COVER WAND RF STERILE (DRAPES) IMPLANT
CUP ACETBLR 52 OD PINNACLE (Hips) ×2 IMPLANT
DERMABOND ADVANCED (GAUZE/BANDAGES/DRESSINGS) ×2
DERMABOND ADVANCED .7 DNX12 (GAUZE/BANDAGES/DRESSINGS) ×1 IMPLANT
DRAPE STERI IOBAN 125X83 (DRAPES) ×3 IMPLANT
DRAPE U-SHAPE 47X51 STRL (DRAPES) ×6 IMPLANT
DRESSING AQUACEL AG SP 3.5X10 (GAUZE/BANDAGES/DRESSINGS) ×1 IMPLANT
DRSG AQUACEL AG SP 3.5X10 (GAUZE/BANDAGES/DRESSINGS) ×3
DURAPREP 26ML APPLICATOR (WOUND CARE) ×3 IMPLANT
ELECT BLADE TIP CTD 4 INCH (ELECTRODE) ×1 IMPLANT
ELECT REM PT RETURN 15FT ADLT (MISCELLANEOUS) ×3 IMPLANT
ELIMINATOR HOLE APEX DEPUY (Hips) ×2 IMPLANT
GLOVE BIO SURGEON STRL SZ 6 (GLOVE) ×6 IMPLANT
GLOVE BIOGEL PI IND STRL 6.5 (GLOVE) ×1 IMPLANT
GLOVE BIOGEL PI IND STRL 8.5 (GLOVE) ×1 IMPLANT
GLOVE BIOGEL PI INDICATOR 6.5 (GLOVE) ×2
GLOVE BIOGEL PI INDICATOR 8.5 (GLOVE) ×2
GLOVE ECLIPSE 8.0 STRL XLNG CF (GLOVE) ×6 IMPLANT
GLOVE ORTHO TXT STRL SZ7.5 (GLOVE) ×6 IMPLANT
GOWN STRL REUS W/TWL LRG LVL3 (GOWN DISPOSABLE) ×6 IMPLANT
GOWN STRL REUS W/TWL XL LVL3 (GOWN DISPOSABLE) ×3 IMPLANT
HEAD CERAMIC 36 PLUS5 (Hips) ×2 IMPLANT
HOLDER FOLEY CATH W/STRAP (MISCELLANEOUS) ×3 IMPLANT
KIT TURNOVER KIT A (KITS) IMPLANT
LINER NEUTRAL 52X36MM PLUS 4 (Liner) ×2 IMPLANT
PACK ANTERIOR HIP CUSTOM (KITS) ×3 IMPLANT
SCREW 6.5MMX30MM (Screw) ×2 IMPLANT
STEM FEMORAL SZ6 HIGH ACTIS (Stem) ×2 IMPLANT
SUT MNCRL AB 4-0 PS2 18 (SUTURE) ×3 IMPLANT
SUT STRATAFIX 0 PDS 27 VIOLET (SUTURE) ×3
SUT VIC AB 1 CT1 36 (SUTURE) ×9 IMPLANT
SUT VIC AB 2-0 CT1 27 (SUTURE) ×4
SUT VIC AB 2-0 CT1 TAPERPNT 27 (SUTURE) ×2 IMPLANT
SUTURE STRATFX 0 PDS 27 VIOLET (SUTURE) ×1 IMPLANT
TRAY FOLEY MTR SLVR 16FR STAT (SET/KITS/TRAYS/PACK) IMPLANT
WATER STERILE IRR 1000ML POUR (IV SOLUTION) ×3 IMPLANT
YANKAUER SUCT BULB TIP 10FT TU (MISCELLANEOUS) IMPLANT

## 2018-11-04 NOTE — Interval H&P Note (Signed)
History and Physical Interval Note:  11/04/2018 7:15 AM  Mary House  has presented today for surgery, with the diagnosis of Left hip osteoarthritis.  The various methods of treatment have been discussed with the patient and family. After consideration of risks, benefits and other options for treatment, the patient has consented to  Procedure(s) with comments: TOTAL HIP ARTHROPLASTY ANTERIOR APPROACH (Left) - 70 mins as a surgical intervention.  The patient's history has been reviewed, patient examined, no change in status, stable for surgery.  I have reviewed the patient's chart and labs.  Questions were answered to the patient's satisfaction.     Mauri Pole

## 2018-11-04 NOTE — Anesthesia Procedure Notes (Signed)
Spinal  Patient location during procedure: OR End time: 11/04/2018 8:44 AM Staffing Resident/CRNA: Lissa Morales, CRNA Performed: resident/CRNA  Preanesthetic Checklist Completed: patient identified, surgical consent, pre-op evaluation, timeout performed, IV checked, risks and benefits discussed and monitors and equipment checked Spinal Block Patient position: sitting Prep: Betadine and DuraPrep Patient monitoring: heart rate, continuous pulse ox and blood pressure Approach: midline Location: L4-5 Injection technique: single-shot Needle Needle type: Pencan  Needle gauge: 24 G Needle length: 9 cm Additional Notes Expiration date of kit checked and confirmed. Patient tolerated procedure well, without complications.

## 2018-11-04 NOTE — Evaluation (Signed)
Physical Therapy Evaluation Patient Details Name: Mary House MRN: [id number] DOB: 12/24/1955 Today's Date: 11/04/2018   History of Present Illness  63 yo female s/p L THR and with R THA-DA 5/12.   Clinical Impression  Pt s/p L THR and presents with decreased L LE strength/ROM and post op pain limiting functional mobility.  Pt should progress to dc home with assist of friends.    Follow Up Recommendations Follow surgeon's recommendation for DC plan and follow-up therapies    Equipment Recommendations  None recommended by PT    Recommendations for Other Services       Precautions / Restrictions Precautions Precautions: Fall Precaution Comments: pt prefers shoes Restrictions Weight Bearing Restrictions: No Other Position/Activity Restrictions: WBAT      Mobility  Bed Mobility Overal bed mobility: Needs Assistance Bed Mobility: Supine to Sit     Supine to sit: Modified independent (Device/Increase time) Sit to supine: Min guard;Min assist   General bed mobility comments: cues for sequence and use of R LE to self assist  Transfers Overall transfer level: Needs assistance Equipment used: Rolling walker (2 wheeled);4-wheeled walker Transfers: Sit to/from Stand Sit to Stand: Min guard         General transfer comment: cues for use of UEs to self assist  Ambulation/Gait Ambulation/Gait assistance: Min guard Gait Distance (Feet): 40 Feet Assistive device: Rolling walker (2 wheeled) Gait Pattern/deviations: Step-to pattern;Decreased step length - left;Decreased step length - right;Shuffle;Trunk flexed Gait velocity: decr   General Gait Details: cues for posture, position from RW and initial sequence  Stairs            Wheelchair Mobility    Modified Rankin (Stroke Patients Only)       Balance Overall balance assessment: Mild deficits observed, not formally tested                                           Pertinent Vitals/Pain Pain  Assessment: 0-10 Pain Score: 6  Pain Location: L hip Pain Descriptors / Indicators: Aching;Sore Pain Intervention(s): Monitored during session;Limited activity within patient's tolerance;Premedicated before session;Ice applied    Home Living Family/patient expects to be discharged to:: Private residence Living Arrangements: Alone Available Help at Discharge: Friend(s);Available PRN/intermittently Type of Home: House Home Access: Stairs to enter Entrance Stairs-Rails: Right Entrance Stairs-Number of Steps: 2+1 Home Layout: One level Home Equipment: Walker - 4 wheels;Grab bars - tub/shower;Grab bars - toilet Additional Comments: friend picking up 3:1 for her    Prior Function Level of Independence: Independent               Hand Dominance        Extremity/Trunk Assessment   Upper Extremity Assessment Upper Extremity Assessment: Overall WFL for tasks assessed    Lower Extremity Assessment Lower Extremity Assessment: Generalized weakness RLE Deficits / Details: generalized weakness 2* THR 09/21/18    Cervical / Trunk Assessment Cervical / Trunk Assessment: Normal  Communication   Communication: No difficulties  Cognition Arousal/Alertness: Awake/alert Behavior During Therapy: WFL for tasks assessed/performed Overall Cognitive Status: Within Functional Limits for tasks assessed                                        General Comments      Exercises Total Joint Exercises Ankle Circles/Pumps:  AROM;Both;10 reps;Seated   Assessment/Plan    PT Assessment Patient needs continued PT services  PT Problem List Decreased strength;Decreased range of motion;Decreased mobility;Decreased activity tolerance;Decreased balance;Decreased knowledge of use of DME;Pain       PT Treatment Interventions DME instruction;Gait training;Functional mobility training;Therapeutic activities;Balance training;Patient/family education;Therapeutic exercise    PT Goals  (Current goals can be found in the Care Plan section)  Acute Rehab PT Goals Patient Stated Goal: regain independence. less pain PT Goal Formulation: With patient Time For Goal Achievement: 11/11/18 Potential to Achieve Goals: Good    Frequency 7X/week   Barriers to discharge        Co-evaluation               AM-PAC PT "6 Clicks" Mobility  Outcome Measure Help needed turning from your back to your side while in a flat bed without using bedrails?: A Little Help needed moving from lying on your back to sitting on the side of a flat bed without using bedrails?: A Little Help needed moving to and from a bed to a chair (including a wheelchair)?: A Little Help needed standing up from a chair using your arms (e.g., wheelchair or bedside chair)?: A Little Help needed to walk in hospital room?: A Little Help needed climbing 3-5 steps with a railing? : A Little 6 Click Score: 18    End of Session Equipment Utilized During Treatment: Gait belt Activity Tolerance: Patient tolerated treatment well Patient left: in chair;with call bell/phone within reach Nurse Communication: Mobility status PT Visit Diagnosis: Other abnormalities of gait and mobility (R26.89);Pain Pain - Right/Left: Left Pain - part of body: Hip    Time: 9163-8466 PT Time Calculation (min) (ACUTE ONLY): 36 min   Charges:   PT Evaluation $PT Eval Low Complexity: 1 Low PT Treatments $Gait Training: 8-22 mins        Debe Coder PT Acute Rehabilitation Services Pager [phone] Office [phone]   Rosali Augello 11/04/2018, 5:22 PM

## 2018-11-04 NOTE — Transfer of Care (Signed)
Immediate Anesthesia Transfer of Care Note  Patient: Mary House  Procedure(s) Performed: TOTAL HIP ARTHROPLASTY ANTERIOR APPROACH (Left Hip)  Patient Location: PACU  Anesthesia Type:spinal  Level of Consciousness: awake, alert , oriented and patient cooperative  Airway & Oxygen Therapy: Patient Spontanous Breathing and Patient connected to face mask oxygen  Post-op Assessment: Report given to RN and Post -op Vital signs reviewed and stable  Post vital signs: stable  Last Vitals:  Vitals Value Taken Time  BP 100/54 11/04/18 1100  Temp 36.3 C 11/04/18 1023  Pulse 53 11/04/18 1112  Resp 15 11/04/18 1112  SpO2 100 % 11/04/18 1112  Vitals shown include unvalidated device data.  Last Pain:  Vitals:   11/04/18 1045  TempSrc:   PainSc: 0-No pain      Patients Stated Pain Goal: 4 (00/71/21 9758)  Complications: No apparent anesthesia complications

## 2018-11-04 NOTE — Anesthesia Postprocedure Evaluation (Signed)
Anesthesia Post Note  Patient: Mary House  Procedure(s) Performed: TOTAL HIP ARTHROPLASTY ANTERIOR APPROACH (Left Hip)     Patient location during evaluation: PACU Anesthesia Type: Spinal Level of consciousness: oriented and awake and alert Pain management: pain level controlled Vital Signs Assessment: post-procedure vital signs reviewed and stable Respiratory status: spontaneous breathing, respiratory function stable and patient connected to nasal cannula oxygen Cardiovascular status: blood pressure returned to baseline and stable Postop Assessment: no headache, no backache and no apparent nausea or vomiting Anesthetic complications: no    Last Vitals:  Vitals:   11/04/18 1257 11/04/18 1410  BP: (!) 103/59 (!) 111/57  Pulse: (!) 56 (!) 59  Resp: 14 16  Temp: 36.6 C 36.6 C  SpO2: 100% 100%    Last Pain:  Vitals:   11/04/18 1410  TempSrc: Oral  PainSc:                  Jarian Longoria

## 2018-11-04 NOTE — Op Note (Signed)
NAME:  Mary House                ACCOUNT NO.: [id number]      MEDICAL RECORD NO.: [id number]      FACILITY:  [facility]      PHYSICIAN:  Shelda PalMatthew D Carianna Lague  DATE OF BIRTH:  01/06/1956     DATE OF PROCEDURE:  11/04/2018                                 OPERATIVE REPORT         PREOPERATIVE DIAGNOSIS: Left  hip osteoarthritis.      POSTOPERATIVE DIAGNOSIS:  Left hip osteoarthritis.      PROCEDURE:  Left total hip replacement through an anterior approach   utilizing DePuy THR system, component size 52mm pinnacle cup, a size 36+4 neutral   Altrex liner, a size 6 Hi Actis stem with a 36+5 delta ceramic   ball.      SURGEON:  Madlyn FrankelMatthew D. Charlann Boxerlin, M.D.      ASSISTANT:  Lanney GinsMatthew Babish, PA-C     ANESTHESIA:  Spinal.      SPECIMENS:  None.      COMPLICATIONS:  None.      BLOOD LOSS:  450 cc     DRAINS:  None.      INDICATION OF THE PROCEDURE:  Mary NoraBrenda Cahall is a 63 y.o. female who had   presented to office for evaluation of left hip pain.  Radiographs revealed   progressive degenerative changes with bone-on-bone   articulation of the  hip joint, including subchondral cystic changes and osteophytes.  The patient had painful limited range of   motion significantly affecting their overall quality of life and function.  The patient was failing to    respond to conservative measures including medications and/or injections and activity modification and at this point was ready   to proceed with more definitive measures.  Consent was obtained for   benefit of pain relief.  Specific risks of infection, DVT, component   failure, dislocation, neurovascular injury, and need for revision surgery were reviewed in the office as well discussion of   the anterior versus posterior approach were reviewed.     PROCEDURE IN DETAIL:  The patient was brought to operative theater.   Once adequate anesthesia, preoperative antibiotics, 2 gm of Ancef, 1 gm of Tranexamic Acid, and 10 mg of Decadron  were administered, the patient was positioned supine on the [employer] table.  Once the patient was safely positioned with adequate padding of boney prominences we predraped out the hip, and used fluoroscopy to confirm orientation of the pelvis.      The left hip was then prepped and draped from proximal iliac crest to   mid thigh with a shower curtain technique.      Time-out was performed identifying the patient, planned procedure, and the appropriate extremity.     An incision was then made 2 cm lateral to the   anterior superior iliac spine extending over the orientation of the   tensor fascia lata muscle and sharp dissection was carried down to the   fascia of the muscle.      The fascia was then incised.  The muscle belly was identified and swept   laterally and retractor placed along the superior neck.  Following   cauterization of the circumflex vessels and removing some pericapsular  fat, a second cobra retractor was placed on the inferior neck.  A T-capsulotomy was made along the line of the   superior neck to the trochanteric fossa, then extended proximally and   distally.  Tag sutures were placed and the retractors were then placed   intracapsular.  We then identified the trochanteric fossa and   orientation of my neck cut and then made a neck osteotomy with the femur on traction.  The femoral   head was removed without difficulty or complication.  Traction was let   off and retractors were placed posterior and anterior around the   acetabulum.      The labrum and foveal tissue were debrided.  I began reaming with a 45 mm   reamer and reamed up to 51 mm reamer with good bony bed preparation and a 52 mm  cup was chosen.  The final 52 mm Pinnacle cup was then impacted under fluoroscopy to confirm the depth of penetration and orientation with respect to   Abduction and forward flexion.  A screw was placed into the ilium followed by the hole eliminator.  The final   36+4 neutral  Altrex liner was impacted with good visualized rim fit.  The cup was positioned anatomically within the acetabular portion of the pelvis.      At this point, the femur was rolled to 100 degrees.  Further capsule was   released off the inferior aspect of the femoral neck.  I then   released the superior capsule proximally.  With the leg in a neutral position the hook was placed laterally   along the femur under the vastus lateralis origin and elevated manually and then held in position using the hook attachment on the bed.  The leg was then extended and adducted with the leg rolled to 100   degrees of external rotation.  Retractors were placed along the medial calcar and posteriorly over the greater trochanter.  Once the proximal femur was fully   exposed, I used a box osteotome to set orientation.  I then began   broaching with the starting chili pepper broach and passed this by hand and then broached up to 6.  With the 6 broach in place I chose a high offset neck and did several trial reductions.  The offset was appropriate, leg lengths   appeared to be equal best matched with the +5 head ball trial confirmed radiographically, matching the previously performed right THR.   Given these findings, I went ahead and dislocated the hip, repositioned all   retractors and positioned the right hip in the extended and abducted position.  The final 6 Hi Actis stem was   chosen and it was impacted down to the level of neck cut.  Based on this   and the trial reductions, a final 36+5 delta ceramic ball was chosen and   impacted onto a clean and dry trunnion, and the hip was reduced.  The   hip had been irrigated throughout the case again at this point.  I did   reapproximate the superior capsular leaflet to the anterior leaflet   using #1 Vicryl.  The fascia of the   tensor fascia lata muscle was then reapproximated using #1 Vicryl and #0 Stratafix sutures.  The   remaining wound was closed with 2-0 Vicryl  and running 4-0 Monocryl.   The hip was cleaned, dried, and dressed sterilely using Dermabond and   Aquacel dressing.  The patient was then brought  to recovery room in stable condition tolerating the procedure well.    Lanney GinsMatthew Babish, PA-C was present for the entirety of the case involved from   preoperative positioning, perioperative retractor management, general   facilitation of the case, as well as primary wound closure as assistant.            Madlyn FrankelMatthew D. Charlann Boxerlin, M.D.        11/04/2018 8:48 AM

## 2018-11-04 NOTE — Discharge Instructions (Signed)

## 2018-11-04 NOTE — Anesthesia Procedure Notes (Signed)
Procedure Name: MAC Date/Time: 11/04/2018 8:45 AM Performed by: Lissa Morales, CRNA Pre-anesthesia Checklist: Patient identified, Emergency Drugs available, Suction available, Patient being monitored and Timeout performed Patient Re-evaluated:Patient Re-evaluated prior to induction Oxygen Delivery Method: Simple face mask Placement Confirmation: positive ETCO2

## 2018-11-05 ENCOUNTER — Encounter (HOSPITAL_COMMUNITY): Payer: Self-pay | Admitting: Orthopedic Surgery

## 2018-11-05 LAB — CBC
HCT: 30.4 % — ABNORMAL LOW (ref 36.0–46.0)
Hemoglobin: 9.7 g/dL — ABNORMAL LOW (ref 12.0–15.0)
MCH: 29.2 pg (ref 26.0–34.0)
MCHC: 31.9 g/dL (ref 30.0–36.0)
MCV: 91.6 fL (ref 80.0–100.0)
Platelets: 153 10*3/uL (ref 150–400)
RBC: 3.32 MIL/uL — ABNORMAL LOW (ref 3.87–5.11)
RDW: 12.5 % (ref 11.5–15.5)
WBC: 10.8 10*3/uL — ABNORMAL HIGH (ref 4.0–10.5)
nRBC: 0 % (ref 0.0–0.2)

## 2018-11-05 LAB — BASIC METABOLIC PANEL
Anion gap: 8 (ref 5–15)
BUN: 8 mg/dL (ref 8–23)
CO2: 25 mmol/L (ref 22–32)
Calcium: 8.2 mg/dL — ABNORMAL LOW (ref 8.9–10.3)
Chloride: 105 mmol/L (ref 98–111)
Creatinine, Ser: 0.51 mg/dL (ref 0.44–1.00)
GFR calc Af Amer: >60 mL/min (ref >60)
GFR calc non Af Amer: >60 mL/min (ref >60)
Glucose, Bld: 162 mg/dL — ABNORMAL HIGH (ref 70–99)
Potassium: 3.7 mmol/L (ref 3.5–5.1)
Sodium: 138 mmol/L (ref 135–145)

## 2018-11-05 MED ORDER — METHOCARBAMOL 500 MG PO TABS: 500.0000 mg | ORAL_TABLET | Freq: Four times a day (QID) | ORAL | 0 refills | Status: AC | PRN

## 2018-11-05 MED ORDER — HYDROCODONE-ACETAMINOPHEN 7.5-325 MG PO TABS: 1.0000 | ORAL_TABLET | ORAL | 0 refills | Status: AC | PRN

## 2018-11-05 MED ORDER — ASPIRIN 81 MG PO CHEW: 81.0000 mg | CHEWABLE_TABLET | Freq: Two times a day (BID) | ORAL | 0 refills | Status: AC

## 2018-11-05 MED ORDER — POLYETHYLENE GLYCOL 3350 17 G PO PACK: 17.0000 g | PACK | Freq: Two times a day (BID) | ORAL | 0 refills | Status: AC

## 2018-11-05 MED ORDER — FERROUS SULFATE 325 (65 FE) MG PO TABS: 325.0000 mg | ORAL_TABLET | Freq: Three times a day (TID) | ORAL | 0 refills | Status: AC

## 2018-11-05 MED ORDER — DOCUSATE SODIUM 100 MG PO CAPS: 100.0000 mg | ORAL_CAPSULE | Freq: Two times a day (BID) | ORAL | 0 refills | Status: AC

## 2018-11-05 NOTE — Progress Notes (Signed)
   Subjective: 1 Day Post-Op Procedure(s) (LRB): TOTAL HIP ARTHROPLASTY ANTERIOR APPROACH (Left) Patient reports pain as mild.   Patient seen in rounds by Dr. Alvan Dame. Patient is well, and has had no acute complaints or problems other than pain in the left hip. No acute events overnight. Patient states she is ready to go home today. We will continue therapy today.   Objective: Vital signs in last 24 hours: Temp:  [97.4 F (36.3 C)-99.2 F (37.3 C)] 99.2 F (37.3 C) (06/26 0502) Pulse Rate:  [46-78] 78 (06/26 0502) Resp:  [11-20] 20 (06/26 0502) BP: (100-120)/(48-71) 107/48 (06/26 0502) SpO2:  [97 %-100 %] 99 % (06/26 0105)  Intake/Output from previous day:  Intake/Output Summary (Last 24 hours) at 11/05/2018 0749 Last data filed at 11/05/2018 0502 Gross per 24 hour  Intake 3673.45 ml  Output 5175 ml  Net -1501.55 ml     Intake/Output this shift: No intake/output data recorded.  Labs: Recent Labs    11/05/18 0314  HGB 9.7*   Recent Labs    11/05/18 0314  WBC 10.8*  RBC 3.32*  HCT 30.4*  PLT 153   Recent Labs    11/05/18 0314  NA 138  K 3.7  CL 105  CO2 25  BUN 8  CREATININE 0.51  GLUCOSE 162*  CALCIUM 8.2*   No results for input(s): LABPT, INR in the last 72 hours.  Exam: General - Patient is Alert and Oriented Extremity - Neurologically intact Sensation intact distally Intact pulses distally Dorsiflexion/Plantar flexion intact Dressing - dressing C/D/I Motor Function - intact, moving foot and toes well on exam.   Past Medical History:  Diagnosis Date  . Arthritis     Assessment/Plan: 1 Day Post-Op Procedure(s) (LRB): TOTAL HIP ARTHROPLASTY ANTERIOR APPROACH (Left) Principal Problem:   S/P left THA, AA  Estimated body mass index is 32.38 kg/m as calculated from the following:   Height as of this encounter: 5\' 6"  (1.676 m).   Weight as of this encounter: 91 kg. Advance diet Up with therapy D/C IV fluids  DVT Prophylaxis - Aspirin  Weight bearing as tolerated. D/C O2 and pulse ox and try on room air.  Plan is to go Home after hospital stay with HEP. Plan for discharge today after 1-2 sessions of physical therapy as long as she continues to meet her goals. Follow up in the office in 2 weeks.  Griffith Citron, PA-C Orthopedic Surgery 11/05/2018, 7:49 AM

## 2018-11-05 NOTE — Progress Notes (Signed)
Physical Therapy Treatment Patient Details Name: Mary House MRN: [id number] DOB: [birth year] Today's Date: 11/05/2018    History of Present Illness 63 yo female s/p L THR and with R THA-DA 5/12.     PT Comments    Pt motivated to progress.  Pt initiated home therex program this am - pt states she has hand outs from previous THR 6 wks ago.  Pt assisted oob and into bathroom.   Follow Up Recommendations  Follow surgeon's recommendation for DC plan and follow-up therapies     Equipment Recommendations  None recommended by PT    Recommendations for Other Services       Precautions / Restrictions Precautions Precautions: Fall Precaution Comments: pt prefers shoes Restrictions Weight Bearing Restrictions: No Other Position/Activity Restrictions: WBAT    Mobility  Bed Mobility Overal bed mobility: Needs Assistance Bed Mobility: Supine to Sit     Supine to sit: Min assist     General bed mobility comments: cues for sequence and use of R LE to self assist  Transfers Overall transfer level: Needs assistance Equipment used: Rolling walker (2 wheeled);4-wheeled walker Transfers: Sit to/from Stand Sit to Stand: Supervision         General transfer comment: cues for use of UEs to self assist  Ambulation/Gait Ambulation/Gait assistance: Min guard;Supervision Gait Distance (Feet): 20 Feet Assistive device: Rolling walker (2 wheeled) Gait Pattern/deviations: Step-to pattern;Decreased step length - left;Decreased step length - right;Shuffle;Trunk flexed Gait velocity: decr   General Gait Details: cues for posture, position from RW and initial sequence   Stairs             Wheelchair Mobility    Modified Rankin (Stroke Patients Only)       Balance Overall balance assessment: Mild deficits observed, not formally tested                                          Cognition Arousal/Alertness: Awake/alert Behavior During Therapy: WFL for tasks  assessed/performed Overall Cognitive Status: Within Functional Limits for tasks assessed                                        Exercises Total Joint Exercises Ankle Circles/Pumps: AROM;Both;Seated;20 reps Quad Sets: AROM;Both;10 reps;Seated Heel Slides: AAROM;Right;Supine;15 reps Hip ABduction/ADduction: AAROM;Right;10 reps;Supine    General Comments        Pertinent Vitals/Pain Pain Assessment: 0-10 Pain Score: 5  Pain Location: L hip Pain Descriptors / Indicators: Aching;Sore Pain Intervention(s): Monitored during session;Premedicated before session;Ice applied    Home Living                      Prior Function            PT Goals (current goals can now be found in the care plan section) Acute Rehab PT Goals Patient Stated Goal: regain independence. less pain PT Goal Formulation: With patient Time For Goal Achievement: 11/11/18 Potential to Achieve Goals: Good Progress towards PT goals: Progressing toward goals    Frequency    7X/week      PT Plan Current plan remains appropriate    Co-evaluation              AM-PAC PT "6 Clicks" Mobility   Outcome Measure  Help needed turning from  your back to your side while in a flat bed without using bedrails?: A Little Help needed moving from lying on your back to sitting on the side of a flat bed without using bedrails?: A Little Help needed moving to and from a bed to a chair (including a wheelchair)?: A Little Help needed standing up from a chair using your arms (e.g., wheelchair or bedside chair)?: A Little Help needed to walk in hospital room?: A Little Help needed climbing 3-5 steps with a railing? : A Little 6 Click Score: 18    End of Session Equipment Utilized During Treatment: Gait belt Activity Tolerance: Patient tolerated treatment well Patient left: Other (comment)(bathroom on comode - CNA aware) Nurse Communication: Mobility status PT Visit Diagnosis: Other  abnormalities of gait and mobility (R26.89);Pain Pain - Right/Left: Left Pain - part of body: Hip     Time: [id number] PT Time Calculation (min) (ACUTE ONLY): 23 min  Charges:  $Gait Training: 8-22 mins $Therapeutic Exercise: 8-22 mins                     Mary KaufmannHunter Nila House PT Acute Rehabilitation Services Pager [phone] Office [phone]    Mary House 11/05/2018, 12:34 PM

## 2018-11-05 NOTE — Progress Notes (Signed)
Physical Therapy Treatment Patient Details Name: Mary House MRN: [id number] DOB: [birth year] Today's Date: 11/05/2018    History of Present Illness 63 yo female s/p L THR and with R THA-DA 5/12.     PT Comments    Pt continues to progress with mobility.  Pt reviewed stairs with written instruction provided.   Follow Up Recommendations  Follow surgeon's recommendation for DC plan and follow-up therapies     Equipment Recommendations  None recommended by PT    Recommendations for Other Services       Precautions / Restrictions Precautions Precautions: Fall Precaution Comments: pt prefers shoes Restrictions Weight Bearing Restrictions: No Other Position/Activity Restrictions: WBAT    Mobility  Bed Mobility Overal bed mobility: Needs Assistance Bed Mobility: Supine to Sit     Supine to sit: Min assist     General bed mobility comments: Pt up on side of bed on arrival  Transfers Overall transfer level: Needs assistance Equipment used: Rolling walker (2 wheeled);4-wheeled walker Transfers: Sit to/from Stand Sit to Stand: Supervision         General transfer comment: cues for use of UEs to self assist  Ambulation/Gait Ambulation/Gait assistance: Min guard;Supervision Gait Distance (Feet): 100 Feet Assistive device: Rolling walker (2 wheeled) Gait Pattern/deviations: Step-to pattern;Decreased step length - left;Decreased step length - right;Shuffle;Trunk flexed Gait velocity: decr   General Gait Details: cues for posture, position from RW and initial sequence   Stairs Stairs: Yes Stairs assistance: Min guard Stair Management: One rail Right;Step to pattern;Forwards;With cane Number of Stairs: 6 General stair comments: single step twice fwd with RW; 2 steps twice with rail and cane   Wheelchair Mobility    Modified Rankin (Stroke Patients Only)       Balance Overall balance assessment: Mild deficits observed, not formally tested                                           Cognition Arousal/Alertness: Awake/alert Behavior During Therapy: WFL for tasks assessed/performed Overall Cognitive Status: Within Functional Limits for tasks assessed                                        Exercises Total Joint Exercises Ankle Circles/Pumps: AROM;Both;Seated;20 reps Quad Sets: AROM;Both;10 reps;Seated Heel Slides: AAROM;Right;Supine;15 reps Hip ABduction/ADduction: AAROM;Right;10 reps;Supine    General Comments        Pertinent Vitals/Pain Pain Assessment: 0-10 Pain Score: 5  Pain Location: L hip Pain Descriptors / Indicators: Aching;Sore Pain Intervention(s): Limited activity within patient's tolerance;Monitored during session;Premedicated before session;Ice applied    Home Living                      Prior Function            PT Goals (current goals can now be found in the care plan section) Acute Rehab PT Goals Patient Stated Goal: regain independence. less pain PT Goal Formulation: With patient Time For Goal Achievement: 11/11/18 Potential to Achieve Goals: Good Progress towards PT goals: Progressing toward goals    Frequency    7X/week      PT Plan Current plan remains appropriate    Co-evaluation              AM-PAC PT "6 Clicks" Mobility  Outcome Measure  Help needed turning from your back to your side while in a flat bed without using bedrails?: A Little Help needed moving from lying on your back to sitting on the side of a flat bed without using bedrails?: A Little Help needed moving to and from a bed to a chair (including a wheelchair)?: A Little Help needed standing up from a chair using your arms (e.g., wheelchair or bedside chair)?: A Little Help needed to walk in hospital room?: A Little Help needed climbing 3-5 steps with a railing? : A Little 6 Click Score: 18    End of Session Equipment Utilized During Treatment: Gait belt Activity Tolerance:  Patient tolerated treatment well Patient left: Other (comment) Nurse Communication: Mobility status PT Visit Diagnosis: Other abnormalities of gait and mobility (R26.89);Pain Pain - Right/Left: Left Pain - part of body: Hip     Time: [id number] PT Time Calculation (min) (ACUTE ONLY): 29 min  Charges:  $Gait Training: 8-22 mins $Therapeutic Exercise: 8-22 mins $Therapeutic Activity: 8-22 mins                     Mary KaufmannHunter Charm House PT Acute Rehabilitation Services Pager [phone] Office [phone]    Mary House 11/05/2018, 12:41 PM

## 2018-11-08 NOTE — Discharge Summary (Signed)
Physician Discharge Summary   Patient ID: Mary House MRN: [id number] DOB/AGE: November 08, 1955 63 y.o.  Admit date: 11/04/2018 Discharge date: 11/05/2018  Primary Diagnosis: Left hip osteoarthritis  Admission Diagnoses:  Past Medical History:  Diagnosis Date  . Arthritis    Discharge Diagnoses:   Principal Problem:   S/P left THA, AA  Estimated body mass index is 32.38 kg/m as calculated from the following:   Height as of this encounter: 5\' 6"  (1.676 m).   Weight as of this encounter: 91 kg.  Procedure:  Procedure(s) (LRB): TOTAL HIP ARTHROPLASTY ANTERIOR APPROACH (Left)   Consults: None  HPI: Mary House is a 63 y.o. female who had  presented to office for evaluation of left hip pain.  Radiographs revealed  progressive degenerative changes with bone-on-bone  articulation of the  hip joint, including subchondral cystic changes and osteophytes.  The patient had painful limited range of motion significantly affecting their overall quality of life and function.  The patient was failing to  respond to conservative measures including medications and/or injections and activity modification and at this point was ready to proceed with more definitive measures.  Consent was obtained for benefit of pain relief.  Specific risks of infection, DVT, component failure, dislocation, neurovascular injury, and need for revision surgery were reviewed in the office as well discussion of the anterior versus posterior approach were reviewed.  Laboratory Data: Admission on 11/04/2018, Discharged on 11/05/2018  Component Date Value Ref Range Status  . WBC 11/05/2018 10.8* 4.0 - 10.5 K/uL Final  . RBC 11/05/2018 3.32* 3.87 - 5.11 MIL/uL Final  . Hemoglobin 11/05/2018 9.7* 12.0 - 15.0 g/dL Final  . HCT 11/05/2018 30.4* 36.0 - 46.0 % Final  . MCV 11/05/2018 91.6  80.0 - 100.0 fL Final  . MCH 11/05/2018 29.2  26.0 - 34.0 pg Final  . MCHC 11/05/2018 31.9  30.0 - 36.0 g/dL Final  . RDW 11/05/2018 12.5  11.5 -  15.5 % Final  . Platelets 11/05/2018 153  150 - 400 K/uL Final  . nRBC 11/05/2018 0.0  0.0 - 0.2 % Final   Performed at [facility], [place] [address], [place], [place] [address]  . Sodium 11/05/2018 138  135 - 145 mmol/L Final  . Potassium 11/05/2018 3.7  3.5 - 5.1 mmol/L Final  . Chloride 11/05/2018 105  98 - 111 mmol/L Final  . CO2 11/05/2018 25  22 - 32 mmol/L Final  . Glucose, Bld 11/05/2018 162* 70 - 99 mg/dL Final  . BUN 11/05/2018 8  8 - 23 mg/dL Final  . Creatinine, Ser 11/05/2018 0.51  0.44 - 1.00 mg/dL Final  . Calcium 11/05/2018 8.2* 8.9 - 10.3 mg/dL Final  . GFR calc non Af Amer 11/05/2018 >60  >60 mL/min Final  . GFR calc Af Amer 11/05/2018 >60  >60 mL/min Final  . Anion gap 11/05/2018 8  5 - 15 Final   Performed at [facility], [place] [address], [place], [place] [address]  Hospital Outpatient Visit on 11/01/2018  Component Date Value Ref Range Status  . SARS Coronavirus 2 11/01/2018 NEGATIVE  NEGATIVE Final   Comment: (NOTE) SARS-CoV-2 target nucleic acids are NOT DETECTED. The SARS-CoV-2 RNA is generally detectable in upper and lower respiratory specimens during the acute phase of infection. Negative results do not preclude SARS-CoV-2 infection, do not rule out co-infections with other pathogens, and should not be used as the sole basis for treatment or other patient management decisions. Negative results must be  combined with clinical observations, patient history, and epidemiological information. The expected result is Negative. Fact Sheet for Patients: FlowerCheck.behttps://www.fda.gov/media/136695/download Fact Sheet for Healthcare Providers: https://www.smith-hall.com/https://www.fda.gov/media/136692/download This test is not yet approved or cleared by the [place] FDA and  has been authorized for detection and/or diagnosis of SARS-CoV-2 by FDA under an Emergency Use Authorization (EUA). This EUA will remain  in effect (meaning this test can be used) for the  duration of the COVID-19 declaration under Section 56                          4(b)(1) of the Act, 21 U.S.C. section 360bbb-3(b)(1), unless the authorization is terminated or revoked sooner. Performed at [facility], [address] [place], [place] [address]   Hospital Outpatient Visit on 10/29/2018  Component Date Value Ref Range Status  . ABO/RH(D) 10/29/2018 A POS   Final  . Antibody Screen 10/29/2018 NEG   Final  . Sample Expiration 10/29/2018 11/07/2018,2359   Final  . Extend sample reason 10/29/2018    Final                   Value:NO TRANSFUSIONS OR PREGNANCY IN THE PAST 3 MONTHS Performed at [facility], [address] [place], [place] [address]   . MRSA, PCR 10/29/2018 NEGATIVE  NEGATIVE Final  . Staphylococcus aureus 10/29/2018 NEGATIVE  NEGATIVE Final   Comment: (NOTE) The Xpert SA Assay (FDA approved for NASAL specimens in patients 63 years of age and older), is one component of a comprehensive surveillance program. It is not intended to diagnose infection nor to guide or monitor treatment. Performed at [facility], [address] [place], [place] [address]   . WBC 10/29/2018 6.5  4.0 - 10.5 K/uL Final  . RBC 10/29/2018 4.41  3.87 - 5.11 MIL/uL Final  . Hemoglobin 10/29/2018 13.2  12.0 - 15.0 g/dL Final  . HCT 91/47/829506/19/2020 41.5  36.0 - 46.0 % Final  . MCV 10/29/2018 94.1  80.0 - 100.0 fL Final  . MCH 10/29/2018 29.9  26.0 - 34.0 pg Final  . MCHC 10/29/2018 31.8  30.0 - 36.0 g/dL Final  . RDW 62/13/086506/19/2020 12.9  11.5 - 15.5 % Final  . Platelets 10/29/2018 148* 150 - 400 K/uL Final  . nRBC 10/29/2018 0.0  0.0 - 0.2 % Final   Performed at [facility], [address] [place], [place] [address]  Admission on 09/21/2018, Discharged on 09/22/2018  Component Date Value Ref Range Status  . WBC 09/22/2018 10.2  4.0 - 10.5 K/uL Final  . RBC 09/22/2018 3.32* 3.87 - 5.11 MIL/uL Final  . Hemoglobin 09/22/2018  10.3* 12.0 - 15.0 g/dL Final  . HCT 62/95/284105/13/2020 31.3* 36.0 - 46.0 % Final  . MCV 09/22/2018 94.3  80.0 - 100.0 fL Final  . MCH 09/22/2018 31.0  26.0 - 34.0 pg Final  . MCHC 09/22/2018 32.9  30.0 - 36.0 g/dL Final  . RDW 32/44/010205/13/2020 12.4  11.5 - 15.5 % Final  . Platelets 09/22/2018 135* 150 - 400 K/uL Final  . nRBC 09/22/2018 0.0  0.0 - 0.2 % Final   Performed at [facility], [address] [place], [place] [address]  . Sodium 09/22/2018 137  135 - 145 mmol/L Final  . Potassium 09/22/2018 3.7  3.5 - 5.1 mmol/L Final  . Chloride 09/22/2018 105  98 - 111 mmol/L Final  . CO2 09/22/2018 25  22 - 32 mmol/L Final  . Glucose, Bld 09/22/2018 166* 70 - 99 mg/dL Final  . BUN 40/98/1191 11  8 - 23 mg/dL Final  . Creatinine, Ser 09/22/2018 0.48  0.44 - 1.00 mg/dL Final  . Calcium 47/82/9562 8.4* 8.9 - 10.3 mg/dL Final  . GFR calc non Af Amer 09/22/2018 >60  >60 mL/min Final  . GFR calc Af Amer 09/22/2018 >60  >60 mL/min Final  . Anion gap 09/22/2018 7  5 - 15 Final   Performed at [facility], [address], [place], [place] [address]  Hospital Outpatient Visit on 09/17/2018  Component Date Value Ref Range Status  . SARS-CoV-2, NAA 09/17/2018 NOT DETECTED  NOT DETECTED Final   Comment: (NOTE) This test was developed and its performance characteristics determined by [employer]. This test has not been FDA cleared or approved. This test has been authorized by FDA under an Emergency Use Authorization (EUA). This test is only authorized for the duration of time the declaration that circumstances exist justifying the authorization of the emergency use of in vitro diagnostic tests for detection of SARS-CoV-2 virus and/or diagnosis of COVID-19 infection under section 564(b)(1) of the Act, 21 U.S.C. 578ION-6(E)(9), unless the authorization is terminated or revoked sooner. When diagnostic testing is negative, the possibility of a false negative result  should be considered in the context of a patient's recent exposures and the presence of clinical signs and symptoms consistent with COVID-19. An individual without symptoms of COVID-19 and who is not shedding SARS-CoV-2 virus would expect to have a negative (not detected) result in this assay. Performed                           At: [facility] [address] [place], [place] [address] Jolene Schimke MD WN:[id number]   . Coronavirus Source 09/17/2018 NASOPHARYNGEAL   Final   Performed at [facility], [address], [place], [place] [address]  Hospital Outpatient Visit on 09/17/2018  Component Date Value Ref Range Status  . ABO/RH(D) 09/17/2018 A POS   Final  . Antibody Screen 09/17/2018 NEG   Final  . Sample Expiration 09/17/2018 09/24/2018,2359   Final  . Extend sample reason 09/17/2018    Final                   Value:NO TRANSFUSIONS OR PREGNANCY IN THE PAST 3 MONTHS Performed at [facility], [address], [place], [place] [address]   . MRSA, PCR 09/17/2018 NEGATIVE  NEGATIVE Final  . Staphylococcus aureus 09/17/2018 POSITIVE* NEGATIVE Final   Comment: (NOTE) The Xpert SA Assay (FDA approved for NASAL specimens in patients 21 years of age and older), is one component of a comprehensive surveillance program. It is not intended to diagnose infection nor to guide or monitor treatment. Performed at [facility], [address] [place], [place] [address]   . Sodium 09/17/2018 140  135 - 145 mmol/L Final  . Potassium 09/17/2018 4.1  3.5 - 5.1 mmol/L Final  . Chloride 09/17/2018 103  98 - 111 mmol/L Final  . CO2 09/17/2018 28  22 - 32 mmol/L Final  . Glucose, Bld 09/17/2018 97  70 - 99 mg/dL Final  . BUN 43/32/9518 14  8 - 23 mg/dL Final  . Creatinine, Ser 09/17/2018 0.48  0.44 - 1.00 mg/dL Final  . Calcium 84/16/6063 9.3  8.9 - 10.3 mg/dL Final  . GFR calc  non Af Amer 09/17/2018 >60  >60 mL/min Final  . GFR calc Af Amer  09/17/2018 >60  >60 mL/min Final  . Anion gap 09/17/2018 9  5 - 15 Final   Performed at [facility], [address] [place], [place] [address]  . WBC 09/17/2018 5.3  4.0 - 10.5 K/uL Final  . RBC 09/17/2018 4.31  3.87 - 5.11 MIL/uL Final  . Hemoglobin 09/17/2018 13.2  12.0 - 15.0 g/dL Final  . HCT 60/45/409805/12/2018 40.7  36.0 - 46.0 % Final  . MCV 09/17/2018 94.4  80.0 - 100.0 fL Final  . MCH 09/17/2018 30.6  26.0 - 34.0 pg Final  . MCHC 09/17/2018 32.4  30.0 - 36.0 g/dL Final  . RDW 11/91/478205/12/2018 12.6  11.5 - 15.5 % Final  . Platelets 09/17/2018 149* 150 - 400 K/uL Final  . nRBC 09/17/2018 0.0  0.0 - 0.2 % Final   Performed at [facility], [address] [place], [place] [address]  . ABO/RH(D) 09/17/2018    Final                   Value:A POS Performed at [facility], [address] [place], [place] [address]      X-Rays:Dg Pelvis Portable  Result Date: 11/04/2018 CLINICAL DATA:  LEFT hip replacement EXAM: PORTABLE PELVIS 1-2 VIEWS COMPARISON:  Portable exam at 1035 hours compared intraoperative images of 0845 hours FINDINGS: Acetabular and femoral components of BILATERAL hip prostheses are identified. Acute postsurgical soft tissue changes are noted in the LEFT hip region. Bones demineralized. No fracture, dislocation, or bone destruction. IMPRESSION: BILATERAL hip prostheses without acute abnormalities. Electronically Signed   By: Ulyses SouthwardMark  Boles M.D.   On: 11/04/2018 11:06   Dg C-arm 1-60 Min-no Report  Result Date: 11/04/2018 CLINICAL DATA:  63 year old female with hip surgery EXAM: OPERATIVE LEFT HIP TECHNIQUE: Fluoroscopic spot image(s) were submitted for interpretation post-operatively. COMPARISON:  None. FINDINGS: Limited intraoperative fluoroscopic spot images demonstrating surgical changes of left hip arthroplasty. Existing changes of right hip arthroplasty. No immediate complicating features. IMPRESSION: Intraoperative images  during left hip arthroplasty. Please refer to the dictated operative report for full details of intraoperative findings and procedure. Electronically Signed   By: Gilmer MorJaime  Wagner D.O.   On: 11/04/2018 11:55   Dg Hip Operative Unilat W Or W/o Pelvis Left  Result Date: 11/04/2018 CLINICAL DATA:  63 year old female with hip surgery EXAM: OPERATIVE LEFT HIP TECHNIQUE: Fluoroscopic spot image(s) were submitted for interpretation post-operatively. COMPARISON:  None. FINDINGS: Limited intraoperative fluoroscopic spot images demonstrating surgical changes of left hip arthroplasty. Existing changes of right hip arthroplasty. No immediate complicating features. IMPRESSION: Intraoperative images during left hip arthroplasty. Please refer to the dictated operative report for full details of intraoperative findings and procedure. Electronically Signed   By: Gilmer MorJaime  Wagner D.O.   On: 11/04/2018 11:55    EKG:No orders found for this or any previous visit.   Hospital Course: Mary House is a 63 y.o. who was [facility]. They were brought to the operating room on 11/04/2018 and underwent Procedure(s): TOTAL HIP ARTHROPLASTY ANTERIOR APPROACH.  Patient tolerated the procedure well and was later transferred to the recovery room and then to the orthopaedic floor for postoperative care. They were given PO and IV analgesics for pain control following their surgery. They were given 24 hours of postoperative antibiotics of  Anti-infectives (From admission, onward)   Start     Dose/Rate Route Frequency  Ordered Stop   11/04/18 1500  ceFAZolin (ANCEF) IVPB 2g/100 mL premix     2 g 200 mL/hr over 30 Minutes Intravenous Every 6 hours 11/04/18 1301 11/04/18 2118   11/04/18 0700  ceFAZolin (ANCEF) IVPB 2g/100 mL premix     2 g 200 mL/hr over 30 Minutes Intravenous On call to O.R. 11/04/18 [id number] 11/04/18 0901     and started on DVT prophylaxis in the form of Aspirin.   PT and OT were ordered for total joint  protocol. Discharge planning consulted to help with postop disposition and equipment needs.  Patient had a good night on the evening of surgery. They started to get up OOB with therapy on POD #0. Pt was seen during rounds and was ready to go home pending progress with therapy. She worked with therapy on POD #1 and was meeting her goals. Pt was discharged to home later that day in stable condition.  Diet: Regular diet Activity: WBAT Follow-up: in 2 weeks Disposition: Home Discharged Condition: good    Allergies as of 11/05/2018      Reactions   Sulfamethoxazole Hives      Tomato Other (See Comments)   Skin rash/psoriatic arthritis/inflammatory reaction   Ciprofloxacin Hives   Gluten Meal Swelling   Other Swelling   night shade: white potato, tomatoes, bell peppers and other raw vegetables: skin rash/ psoriatic arthritis/ inflammatory reaction   Penicillins Hives   Did it involve swelling of the face/tongue/throat, SOB, or low BP? No Did it involve sudden or severe rash/hives, skin peeling, or any reaction on the inside of your mouth or nose? No Did you need to seek medical attention at a hospital or doctor's office? Yes When did it last happen?childhood allergy If all above answers are "NO", may proceed with cephalosporin use.   Sulfur Hives      Medication List    TAKE these medications   aspirin 81 MG chewable tablet Commonly known as: Aspirin Childrens Chew 1 tablet (81 mg total) by mouth 2 (two) times daily for 30 days. Take for 4 weeks, then resume regular dose.   bacitracin 500 UNIT/GM ointment Apply 1 application topically daily as needed for wound care.   docusate sodium 100 MG capsule Commonly known as: Colace Take 1 capsule (100 mg total) by mouth 2 (two) times daily.   ferrous sulfate 325 (65 FE) MG tablet Commonly known as: FerrouSul Take 1 tablet (325 mg total) by mouth 3 (three) times daily with meals for 14 days.   HYDROcodone-acetaminophen 7.5-325 MG  tablet Commonly known as: Norco Take 1-2 tablets by mouth every 4 (four) hours as needed for moderate pain.   methocarbamol 500 MG tablet Commonly known as: Robaxin Take 1 tablet (500 mg total) by mouth every 6 (six) hours as needed for muscle spasms.   mineral oil-hydrophilic petrolatum ointment Apply 1 application topically daily as needed for dry skin or irritation.   polyethylene glycol 17 g packet Commonly known as: MIRALAX / GLYCOLAX Take 17 g by mouth 2 (two) times daily.   pregabalin 75 MG capsule Commonly known as: LYRICA Take 75-150 mg by mouth See admin instructions. Take 75 mg in the morning and 150 mg in the evening      Follow-up Information    Durene Romanslin, Matthew, MD. Schedule an appointment as soon as possible for a visit in 2 weeks.   Specialty: Orthopedic Surgery Contact information: [address] [place] [address] [phone]  Signed: Dennie Bible, PA-C Orthopedic Surgery 11/08/2018, 8:26 AM

## 2021-01-13 IMAGING — RF OPERATIVE RIGHT HIP WITH PELVIS
1 series · 2 of 2 positions shown · non-contrast
Comparison: None.

CLINICAL DATA: Right hip replacement

EXAM:
OPERATIVE RIGHT HIP (WITH PELVIS IF PERFORMED) 2 VIEWS
TECHNIQUE: Fluoroscopic spot image(s) were submitted for interpretation
post-operatively.
FLUOROSCOPY TIME:  22 seconds

[Series 1: run · 2 of 2 slices shown]
[im 1/2]
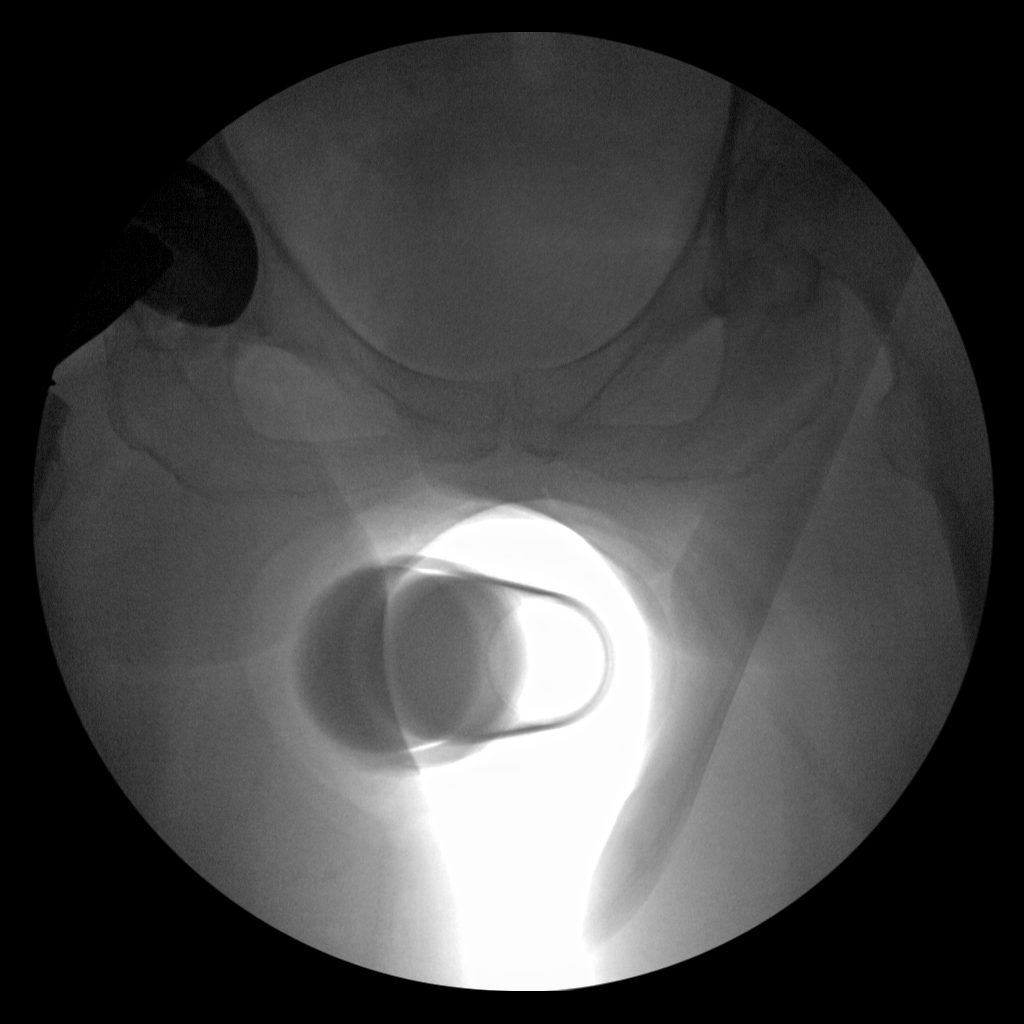
[im 2/2]
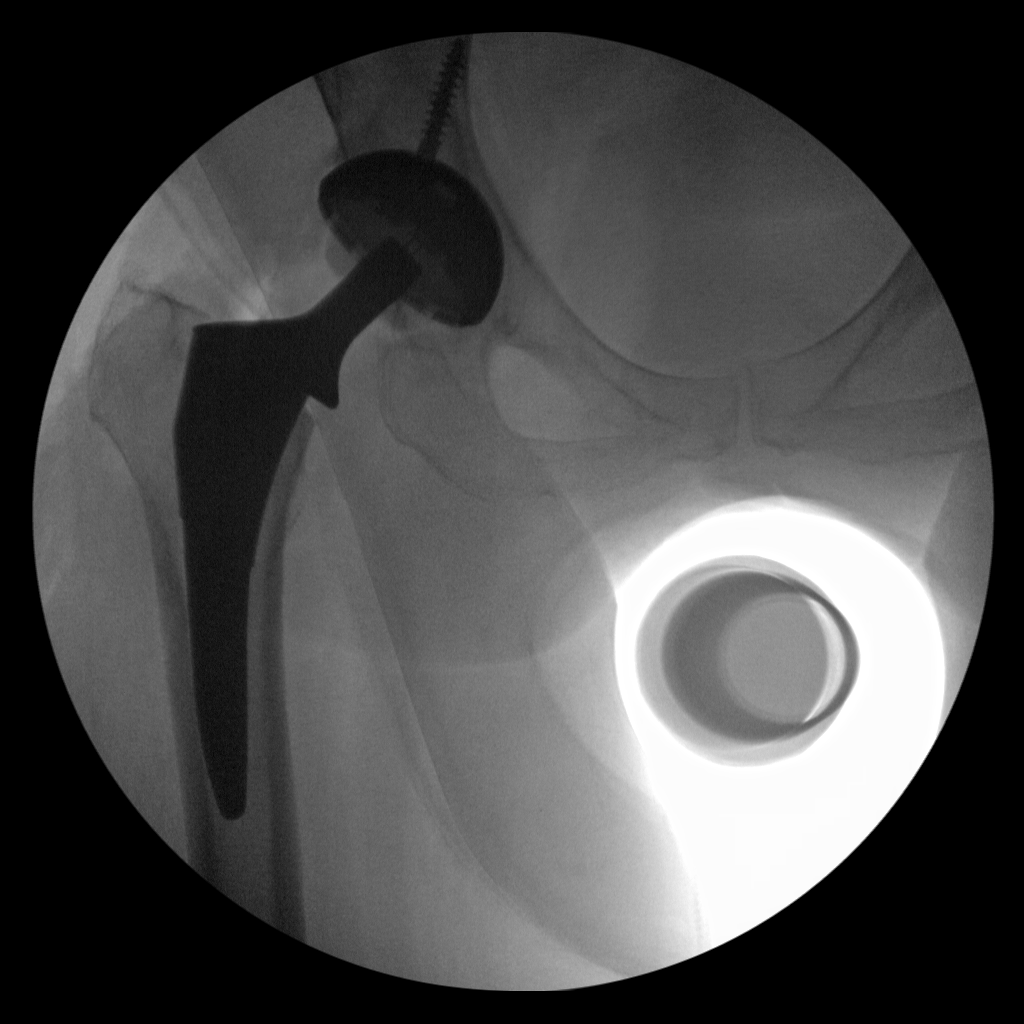

[2 of 2 positions shown; findings below may reference images not displayed]

FINDINGS: Two spot intraoperative fluoroscopic images the right hip are
provided for review and demonstrate the sequela of ongoing right
total hip replacement. Alignment appears anatomic given anterior
projection.

There is a minimal amount of subcutaneous emphysema about the
operative site. No radiopaque foreign body.
IMPRESSION: Post right total hip replacement without evidence of complication.

## 2021-02-26 IMAGING — RF OPERATIVE LEFT HIP WITH PELVIS
1 series · 2 of 2 positions shown · non-contrast
Comparison: None.

CLINICAL DATA: 63-year-old female with hip surgery

EXAM:
OPERATIVE LEFT HIP
TECHNIQUE: Fluoroscopic spot image(s) were submitted for interpretation
post-operatively.

[Series 1: run · 2 of 2 slices shown]
[im 1/2]
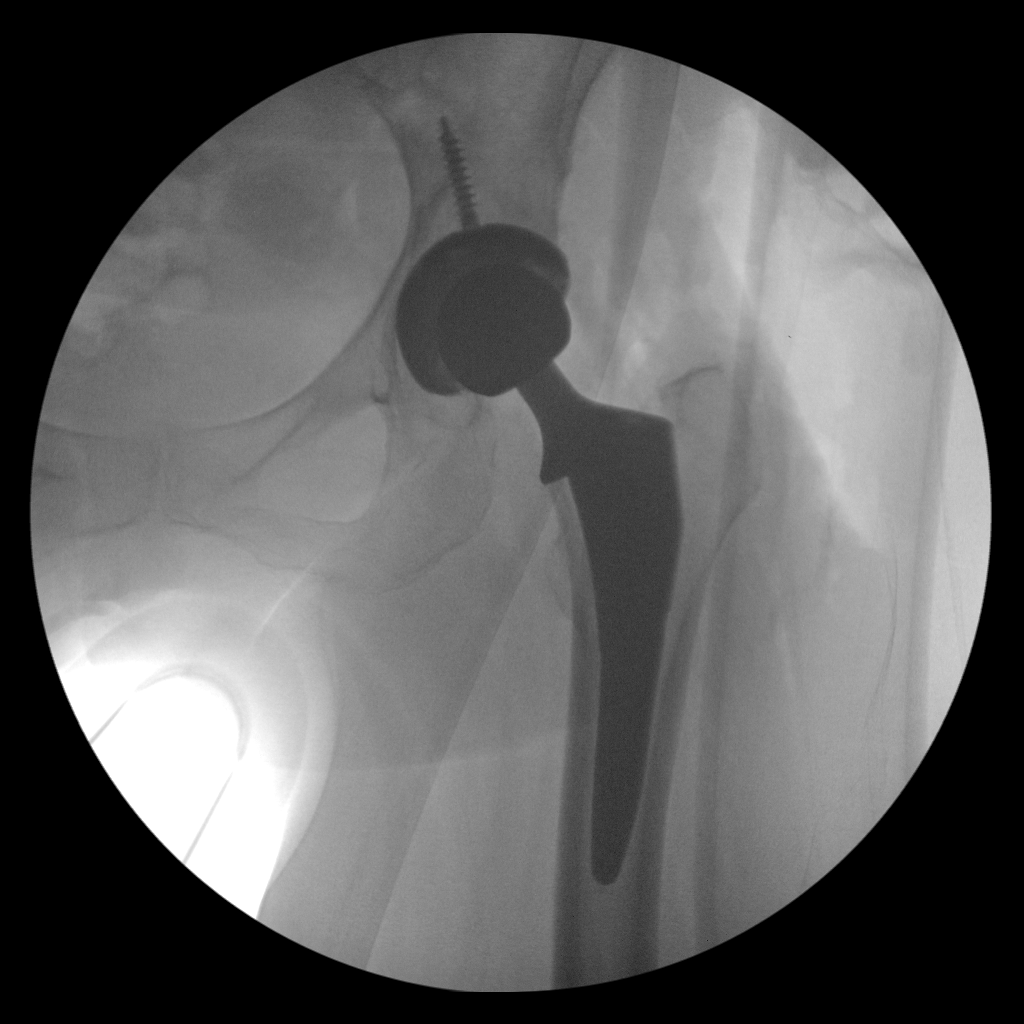
[im 2/2]
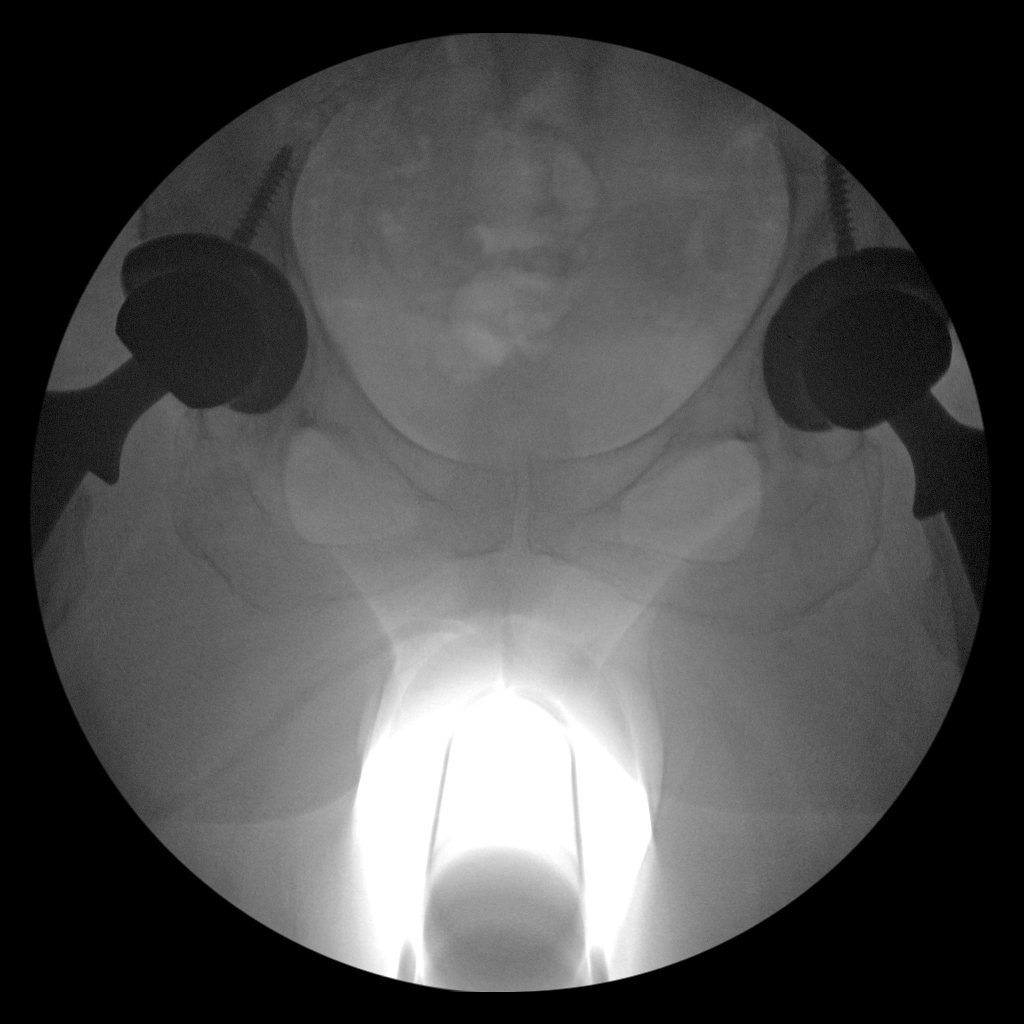

[2 of 2 positions shown; findings below may reference images not displayed]

FINDINGS: Limited intraoperative fluoroscopic spot images demonstrating
surgical changes of left hip arthroplasty. Existing changes of right
hip arthroplasty. No immediate complicating features.
IMPRESSION: Intraoperative images during left hip arthroplasty. Please refer to
the dictated operative report for full details of intraoperative
findings and procedure.
